# Patient Record
Sex: Female | Born: 1965 | Race: White | Hispanic: No | Marital: Married | Smoking: Former smoker
Health system: Southern US, Community
[De-identification: ages and names within clinical notes are randomized; demographics above are authoritative.]

## PROBLEM LIST (undated history)

## (undated) ENCOUNTER — Ambulatory Visit: Discharge status: Absent without leave | Payer: 59

## (undated) DIAGNOSIS — M797 Fibromyalgia: Secondary | ICD-10-CM

## (undated) DIAGNOSIS — F329 Major depressive disorder, single episode, unspecified: Secondary | ICD-10-CM

## (undated) DIAGNOSIS — G43909 Migraine, unspecified, not intractable, without status migrainosus: Secondary | ICD-10-CM

## (undated) DIAGNOSIS — J45909 Unspecified asthma, uncomplicated: Secondary | ICD-10-CM

## (undated) DIAGNOSIS — F32A Depression, unspecified: Secondary | ICD-10-CM

## (undated) DIAGNOSIS — F419 Anxiety disorder, unspecified: Secondary | ICD-10-CM

## (undated) HISTORY — DX: Depression, unspecified: F32.A

## (undated) HISTORY — PX: ABDOMINAL HYSTERECTOMY: SHX81

## (undated) HISTORY — DX: Migraine, unspecified, not intractable, without status migrainosus: G43.909

## (undated) HISTORY — DX: Unspecified asthma, uncomplicated: J45.909

## (undated) HISTORY — PX: COLONOSCOPY: SHX174

## (undated) HISTORY — DX: Fibromyalgia: M79.7

## (undated) HISTORY — PX: BILATERAL OOPHORECTOMY: SHX1221

## (undated) HISTORY — DX: Major depressive disorder, single episode, unspecified: F32.9

---

## 1999-07-01 DIAGNOSIS — G43E09 Chronic migraine with aura, not intractable, without status migrainosus: Secondary | ICD-10-CM | POA: Insufficient documentation

## 2007-10-21 DIAGNOSIS — M797 Fibromyalgia: Secondary | ICD-10-CM | POA: Insufficient documentation

## 2007-12-28 DIAGNOSIS — F419 Anxiety disorder, unspecified: Secondary | ICD-10-CM | POA: Insufficient documentation

## 2012-07-14 DIAGNOSIS — E7800 Pure hypercholesterolemia, unspecified: Secondary | ICD-10-CM | POA: Insufficient documentation

## 2015-07-19 DIAGNOSIS — J4520 Mild intermittent asthma, uncomplicated: Secondary | ICD-10-CM | POA: Insufficient documentation

## 2015-07-19 DIAGNOSIS — J4540 Moderate persistent asthma, uncomplicated: Secondary | ICD-10-CM | POA: Insufficient documentation

## 2015-11-12 ENCOUNTER — Encounter: Payer: Self-pay | Admitting: Internal Medicine

## 2015-11-27 ENCOUNTER — Ambulatory Visit: Payer: Self-pay | Admitting: Gastroenterology

## 2015-12-03 ENCOUNTER — Encounter: Payer: Self-pay | Admitting: Gastroenterology

## 2015-12-03 ENCOUNTER — Other Ambulatory Visit: Payer: Self-pay

## 2015-12-03 ENCOUNTER — Ambulatory Visit (INDEPENDENT_AMBULATORY_CARE_PROVIDER_SITE_OTHER): Payer: Managed Care, Other (non HMO) | Admitting: Gastroenterology

## 2015-12-03 VITALS — BP 149/77 | HR 110 | Temp 99.2°F | Ht 67.0 in | Wt 163.0 lb

## 2015-12-03 DIAGNOSIS — K625 Hemorrhage of anus and rectum: Secondary | ICD-10-CM | POA: Insufficient documentation

## 2015-12-03 DIAGNOSIS — K5900 Constipation, unspecified: Secondary | ICD-10-CM | POA: Insufficient documentation

## 2015-12-03 MED ORDER — PEG-KCL-NACL-NASULF-NA ASC-C 100 G PO SOLR: 1.0000 | ORAL | Status: DC

## 2015-12-03 NOTE — Progress Notes (Signed)
Primary Care Physician:  Kathlee Nations, MD  Primary Gastroenterologist:  Jonette Eva, MD   Chief Complaint  Patient presents with  . Abdominal Pain  . Nausea  . Diarrhea    HPI:  Pamela Scott is a 50 y.o. female here at the request of Dr. Kathlee Nations for further evaluation of diarrhea, abdominal pain, rectal bleeding. Patient states she had acute onset illness approximately one month ago. Symptoms began with diarrhea, developed blood per rectum after couple of days. Associated left lower quadrant abdominal pain. Symptoms resolved on her own. At baseline she generally has chronic constipation. If she does not have a bowel movement after 4 days, then she utilizes Correctol. Usually takes this once or twice per month. Rarely has heartburn. She takes Tagamet for hives. Digital rectal exam by her PCP showed no evidence of hemorrhoids, fissures. She was heme positive.. Patient was treated with Cipro 500 mg twice daily for 10 days.   No prior colonoscopy.   Current Outpatient Prescriptions  Medication Sig Dispense Refill  . albuterol (PROAIR HFA) 108 (90 Base) MCG/ACT inhaler Inhale into the lungs.    . busPIRone (BUSPAR) 10 MG tablet Take by mouth.    . cimetidine (TAGAMET) 400 MG tablet     . escitalopram (LEXAPRO) 20 MG tablet Take by mouth.    Marland Kitchen HYDROcodone-ibuprofen (VICOPROFEN) 7.5-200 MG tablet Take by mouth.    . hydrOXYzine (ATARAX/VISTARIL) 25 MG tablet Take by mouth.    Marland Kitchen tiZANidine (ZANAFLEX) 4 MG tablet Take by mouth.    . topiramate (TOPAMAX) 100 MG tablet Take by mouth.     No current facility-administered medications for this visit.    Allergies as of 12/03/2015 - Review Complete 12/03/2015  Allergen Reaction Noted  . Amoxicillin  12/03/2015  . Tape Rash 12/03/2015    Past Medical History  Diagnosis Date  . Asthma   . Fibromyalgia   . Depression   . Migraines     Past Surgical History  Procedure Laterality Date  . Abdominal hysterectomy  1990s   endometriosis  . Bilateral oophorectomy      Family History  Problem Relation Age of Onset  . Colon cancer Neg Hx   . Inflammatory bowel disease Neg Hx   . Rheum arthritis Mother   . Heart disease Paternal Grandfather   . Heart disease Paternal Grandmother     Social History   Social History  . Marital Status: Unknown    Spouse Name: N/A  . Number of Children: 0  . 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 Smoking status: Former Smoker    Start date: 12/02/1997  . Smokeless tobacco: Not on file  . Alcohol Use: Not on file  . Drug Use: Not on file  . Sexual Activity: Not on file   Other Topics Concern  . Not on file   Social History Narrative  . No narrative on file      ROS:  General: Negative for anorexia, weight loss, fever, chills, fatigue, weakness. Eyes: Negative for vision changes.  ENT: Negative for hoarseness, difficulty swallowing , nasal congestion. CV: Negative for chest pain, angina, palpitations, dyspnea on exertion, peripheral edema.  Respiratory: Negative for dyspnea at rest, dyspnea on exertion, cough, sputum, wheezing.  GI: See history of present illness. GU:  Negative for dysuria, hematuria, urinary incontinence, urinary frequency, nocturnal urination.  MS: Negative for joint pain, low back pain.  Derm: Negative for rash or itching.  Neuro: Negative for weakness, abnormal sensation, seizure, frequent headaches, memory loss, confusion.  Psych: Negative for anxiety, depression, suicidal ideation, hallucinations.  Endo: Negative for unusual weight change.  Heme: Negative for bruising or bleeding. Allergy: Negative for rash or hives.    Physical Examination:  BP 149/77 mmHg  Pulse 110  Temp(Src) 99.2 F (37.3 C)  Ht  (1.702 m)  Wt 163 lb (73.936 kg)  BMI 25.52 kg/m2   General: Well-nourished, well-developed in no acute distress.  Head: Normocephalic, atraumatic.    Eyes: Conjunctiva pink, no icterus. Mouth: Oropharyngeal mucosa moist and pink , no lesions erythema or exudate. Neck: Supple without thyromegaly, masses, or lymphadenopathy.  Lungs: Clear to auscultation bilaterally.  Heart: Regular rate and rhythm, no murmurs rubs or gallops.  Abdomen: Bowel sounds are normal, nontender, nondistended, no hepatosplenomegaly or masses, no abdominal bruits or    hernia , no rebound or guarding.   Rectal: Not performed Extremities: No lower extremity edema. No clubbing or deformities.  Neuro: Alert and oriented x 4 , grossly normal neurologically.  Skin: Warm and dry, no rash or jaundice.   Psych: Alert and cooperative, normal mood and affect.  Labs: Labs from 11/07/2015 White blood cell count 8100, hemoglobin 12.5, platelets 296,000, sodium 139, potassium 3.3, albumin 4.3, BUN 17, creatinine 0.94, total bilirubin 0.6, alkaline phosphatase 69, AST 22, ALT 23, albumin 4.3, amylase 33.  Imaging Studies: No results found.

## 2015-12-03 NOTE — Patient Instructions (Signed)
Colonoscopy as scheduled. See separate instructions.  For chronic constipation, you can take miralax one capful daily as needed.   Constipation, Adult Constipation is when a person has fewer than three bowel movements a week, has difficulty having a bowel movement, or has stools that are dry, hard, or larger than normal. As people grow older, constipation is more common. A low-fiber diet, not taking in enough fluids, and taking certain medicines may make constipation worse.  CAUSES   Certain medicines, such as antidepressants, pain medicine, iron supplements, antacids, and water pills.   Certain diseases, such as diabetes, irritable bowel syndrome (IBS), thyroid disease, or depression.   Not drinking enough water.   Not eating enough fiber-rich foods.   Stress or travel.   Lack of physical activity or exercise.   Ignoring the urge to have a bowel movement.   Using laxatives too much.  SIGNS AND SYMPTOMS   Having fewer than three bowel movements a week.   Straining to have a bowel movement.   Having stools that are hard, dry, or larger than normal.   Feeling full or bloated.   Pain in the lower abdomen.   Not feeling relief after having a bowel movement.  DIAGNOSIS  Your health care provider will take a medical history and perform a physical exam. Further testing may be done for severe constipation. Some tests may include:  A barium enema X-ray to examine your rectum, colon, and, sometimes, your small intestine.   A sigmoidoscopy to examine your lower colon.   A colonoscopy to examine your entire colon. TREATMENT  Treatment will depend on the severity of your constipation and what is causing it. Some dietary treatments include drinking more fluids and eating more fiber-rich foods. Lifestyle treatments may include regular exercise. If these diet and lifestyle recommendations do not help, your health care provider may recommend taking over-the-counter laxative  medicines to help you have bowel movements. Prescription medicines may be prescribed if over-the-counter medicines do not work.  HOME CARE INSTRUCTIONS   Eat foods that have a lot of fiber, such as fruits, vegetables, whole grains, and beans.  Limit foods high in fat and processed sugars, such as french fries, hamburgers, cookies, candies, and soda.   A fiber supplement may be added to your diet if you cannot get enough fiber from foods.   Drink enough fluids to keep your urine clear or pale yellow.   Exercise regularly or as directed by your health care provider.   Go to the restroom when you have the urge to go. Do not hold it.   Only take over-the-counter or prescription medicines as directed by your health care provider. Do not take other medicines for constipation without talking to your health care provider first.  SEEK IMMEDIATE MEDICAL CARE IF:   You have bright red blood in your stool.   Your constipation lasts for more than 4 days or gets worse.   You have abdominal or rectal pain.   You have thin, pencil-like stools.   You have unexplained weight loss. MAKE SURE YOU:   Understand these instructions.  Will watch your condition.  Will get help right away if you are not doing well or get worse.   This information is not intended to replace advice given to you by your health care provider. Make sure you discuss any questions you have with your health care provider.   Document Released: 06/20/2004 Document Revised: 10/13/2014 Document Reviewed: 07/04/2013 Elsevier Interactive Patient Education [employer]

## 2015-12-05 ENCOUNTER — Other Ambulatory Visit: Payer: Self-pay

## 2015-12-05 DIAGNOSIS — Z79899 Other long term (current) drug therapy: Secondary | ICD-10-CM

## 2015-12-05 NOTE — Assessment & Plan Note (Signed)
50 year old female with recent diarrheal illness, likely viral gastroenteritis, who presents for further evaluation of rectal bleeding. She has never had a colonoscopy. At baseline she has constipation and takes occasional laxative. She can utilize MiraLAX 1 capful daily as needed for constipation. We discussed prescription options but she would like to continue over-the-counter options for now. Recommend colonoscopy in the near future. Augment conscious sedation with Phenergan 25 mg IV 30 minutes before the procedure given polypharmacy.  I have discussed the risks, alternatives, benefits with regards to but not limited to the risk of reaction to medication, bleeding, infection, perforation and the patient is agreeable to proceed. Written consent to be obtained.  Encouraged her to make sure she is having regular BMs leading up into the bowel prep in order to facilitate adequate bowel preparation.

## 2015-12-05 NOTE — Progress Notes (Signed)
CC'ED TO PCP 

## 2015-12-05 NOTE — Progress Notes (Signed)
Can we please add Phenergan 25 mg IV 30 minutes before her colonoscopy due to polypharmacy.

## 2015-12-05 NOTE — Progress Notes (Signed)
Done

## 2015-12-06 ENCOUNTER — Other Ambulatory Visit: Payer: Self-pay

## 2015-12-24 ENCOUNTER — Encounter (HOSPITAL_COMMUNITY)
Admission: RE | Disposition: A | Discharge status: Home / Self Care | Payer: Self-pay | Source: Ambulatory Visit | Attending: Gastroenterology

## 2015-12-24 ENCOUNTER — Ambulatory Visit (HOSPITAL_COMMUNITY)
Admission: RE | Admit: 2015-12-24 | Discharge: 2015-12-24 | Disposition: A | Discharge status: Home / Self Care | Payer: Managed Care, Other (non HMO) | Source: Ambulatory Visit | Attending: Gastroenterology | Admitting: Gastroenterology

## 2015-12-24 ENCOUNTER — Encounter (HOSPITAL_COMMUNITY): Payer: Self-pay | Admitting: *Deleted

## 2015-12-24 DIAGNOSIS — K5730 Diverticulosis of large intestine without perforation or abscess without bleeding: Secondary | ICD-10-CM | POA: Diagnosis not present

## 2015-12-24 DIAGNOSIS — Z87891 Personal history of nicotine dependence: Secondary | ICD-10-CM | POA: Diagnosis not present

## 2015-12-24 DIAGNOSIS — R197 Diarrhea, unspecified: Secondary | ICD-10-CM | POA: Insufficient documentation

## 2015-12-24 DIAGNOSIS — R1084 Generalized abdominal pain: Secondary | ICD-10-CM | POA: Insufficient documentation

## 2015-12-24 DIAGNOSIS — J45909 Unspecified asthma, uncomplicated: Secondary | ICD-10-CM | POA: Diagnosis not present

## 2015-12-24 DIAGNOSIS — F329 Major depressive disorder, single episode, unspecified: Secondary | ICD-10-CM | POA: Insufficient documentation

## 2015-12-24 DIAGNOSIS — F419 Anxiety disorder, unspecified: Secondary | ICD-10-CM | POA: Insufficient documentation

## 2015-12-24 DIAGNOSIS — K921 Melena: Secondary | ICD-10-CM | POA: Diagnosis not present

## 2015-12-24 DIAGNOSIS — M797 Fibromyalgia: Secondary | ICD-10-CM | POA: Insufficient documentation

## 2015-12-24 DIAGNOSIS — Z79899 Other long term (current) drug therapy: Secondary | ICD-10-CM | POA: Insufficient documentation

## 2015-12-24 DIAGNOSIS — K625 Hemorrhage of anus and rectum: Secondary | ICD-10-CM

## 2015-12-24 HISTORY — DX: Anxiety disorder, unspecified: F41.9

## 2015-12-24 HISTORY — PX: COLONOSCOPY: SHX5424

## 2015-12-24 SURGERY — COLONOSCOPY
Anesthesia: Moderate Sedation

## 2015-12-24 MED ORDER — MEPERIDINE HCL 100 MG/ML IJ SOLN
INTRAMUSCULAR | Status: AC
  Filled 2015-12-24: qty 2

## 2015-12-24 MED ORDER — MIDAZOLAM HCL 5 MG/5ML IJ SOLN
INTRAMUSCULAR | Status: DC | PRN
  Administered 2015-12-24: 1 mg via INTRAVENOUS
  Administered 2015-12-24: 2 mg via INTRAVENOUS
  Administered 2015-12-24: 1 mg via INTRAVENOUS

## 2015-12-24 MED ORDER — MIDAZOLAM HCL 5 MG/5ML IJ SOLN
INTRAMUSCULAR | Status: AC
  Filled 2015-12-24: qty 10

## 2015-12-24 MED ORDER — PROMETHAZINE HCL 25 MG/ML IJ SOLN
25.0000 mg | Freq: Once | INTRAMUSCULAR | Status: AC
  Administered 2015-12-24: 25 mg via INTRAVENOUS

## 2015-12-24 MED ORDER — STERILE WATER FOR IRRIGATION IR SOLN
Status: DC | PRN
  Administered 2015-12-24: 14:00:00

## 2015-12-24 MED ORDER — MEPERIDINE HCL 100 MG/ML IJ SOLN
INTRAMUSCULAR | Status: DC | PRN
  Administered 2015-12-24: 50 mg via INTRAVENOUS
  Administered 2015-12-24 (×2): 25 mg via INTRAVENOUS

## 2015-12-24 MED ORDER — PROMETHAZINE HCL 25 MG/ML IJ SOLN
INTRAMUSCULAR | Status: AC
  Filled 2015-12-24: qty 1

## 2015-12-24 MED ORDER — SODIUM CHLORIDE 0.9% FLUSH
INTRAVENOUS | Status: AC
  Filled 2015-12-24: qty 10

## 2015-12-24 MED ORDER — SODIUM CHLORIDE 0.9 % IV SOLN
INTRAVENOUS | Status: DC
Start: 2015-12-24 — End: 2015-12-24
  Administered 2015-12-24: 12:00:00 via INTRAVENOUS

## 2015-12-24 NOTE — Discharge Instructions (Signed)
YOU MOST LIKELY HAD AN ACUTE COLITIS, WHICH HAS RESOLVED. I FOUND ONE DIVERTICULUM IN YOUR LEFT COLON. You have SMALL internal hemorrhoids. YOU DID NOT HAVE ANY POLYPS. YOU HAVE A FLOPPY SIGMOID AND TRANSVERSE COLON.   DRINK WATER TO KEEP YOUR URINE LIGHT YELLOW.  FOLLOW A HIGH FIBER DIET. AVOID ITEMS THAT CAUSE BLOATING. SEE INFO BELOW.  USE MILK OF MAGNESIA PILLS(2) AT LEAST ONCE DAILY AND UP TO THREE TIMES A DAY TO REDUCE CONSTIPATION.  PLEASE CALL IN ONE MONTH IF YOUR CONSTIPATION IS NOT BETTER THEN WE CAN PRESCRIBE LINZESS.  FOLLOW UP IN 4 MOS.   Next colonoscopy in 10 years.   Colonoscopy Care After Read the instructions outlined below and refer to this sheet in the next week. These discharge instructions provide you with general information on caring for yourself after you leave the hospital. While your treatment has been planned according to the most current medical practices available, unavoidable complications occasionally occur. If you have any problems or questions after discharge, call DR. Gennaro Lizotte, [phone].  ACTIVITY  You may resume your regular activity, but move at a slower pace for the next 24 hours.   Take frequent rest periods for the next 24 hours.   Walking will help get rid of the air and reduce the bloated feeling in your belly (abdomen).   No driving for 24 hours (because of the medicine (anesthesia) used during the test).   You may shower.   Do not sign any important legal documents or operate any machinery for 24 hours (because of the anesthesia used during the test).    NUTRITION  Drink plenty of fluids.   You may resume your normal diet as instructed by your doctor.   Begin with a light meal and progress to your normal diet. Heavy or fried foods are harder to digest and may make you feel sick to your stomach (nauseated).   Avoid alcoholic beverages for 24 hours or as instructed.    MEDICATIONS  You may resume your normal  medications.   WHAT YOU CAN EXPECT TODAY  Some feelings of bloating in the abdomen.   Passage of more gas than usual.   Spotting of blood in your stool or on the toilet paper  .  IF YOU HAD POLYPS REMOVED DURING THE COLONOSCOPY:  Eat a soft diet IF YOU HAVE NAUSEA, BLOATING, ABDOMINAL PAIN, OR VOMITING.    FINDING OUT THE RESULTS OF YOUR TEST Not all test results are available during your visit. DR. Darrick PennaFIELDS WILL CALL YOU WITHIN 7 DAYS OF YOUR PROCEDUE WITH YOUR RESULTS. Do not assume everything is normal if you have not heard from DR. Doryce Mcgregory IN ONE WEEK, CALL HER OFFICE AT [phone].  SEEK IMMEDIATE MEDICAL ATTENTION AND CALL THE OFFICE: [phone] IF:  You have more than a spotting of blood in your stool.   Your belly is swollen (abdominal distention).   You are nauseated or vomiting.   You have a temperature over 101F.   You have abdominal pain or discomfort that is severe or gets worse throughout the day.  High-Fiber Diet A high-fiber diet changes your normal diet to include more whole grains, legumes, fruits, and vegetables. Changes in the diet involve replacing refined carbohydrates with unrefined foods. The calorie level of the diet is essentially unchanged. The Dietary Reference Intake (recommended amount) for adult males is 38 grams per day. For adult females, it is 25 grams per day. Pregnant and lactating women should consume 28 grams of fiber  per day. Fiber is the intact part of a plant that is not broken down during digestion. Functional fiber is fiber that has been isolated from the plant to provide a beneficial effect in the body. PURPOSE  Increase stool bulk.   Ease and regulate bowel movements.   Lower cholesterol.  REDUCE RISK OF COLON CANCER  INDICATIONS THAT YOU NEED MORE FIBER  Constipation and hemorrhoids.   Uncomplicated diverticulosis (intestine condition) and irritable bowel syndrome.   Weight management.   As a protective measure against  hardening of the arteries (atherosclerosis), diabetes, and cancer.   GUIDELINES FOR INCREASING FIBER IN THE DIET  Start adding fiber to the diet slowly. A gradual increase of about 5 more grams (2 slices of whole-wheat bread, 2 servings of most fruits or vegetables, or 1 bowl of high-fiber cereal) per day is best. Too rapid an increase in fiber may result in constipation, flatulence, and bloating.   Drink enough water and fluids to keep your urine clear or pale yellow. Water, juice, or caffeine-free drinks are recommended. Not drinking enough fluid may cause constipation.   Eat a variety of high-fiber foods rather than one type of fiber.   Try to increase your intake of fiber through using high-fiber foods rather than fiber pills or supplements that contain small amounts of fiber.   The goal is to change the types of food eaten. Do not supplement your present diet with high-fiber foods, but replace foods in your present diet.   INCLUDE A VARIETY OF FIBER SOURCES  Replace refined and processed grains with whole grains, canned fruits with fresh fruits, and incorporate other fiber sources. White rice, white breads, and most bakery goods contain little or no fiber.   Brown whole-grain rice, buckwheat oats, and many fruits and vegetables are all good sources of fiber. These include: broccoli, Brussels sprouts, cabbage, cauliflower, beets, sweet potatoes, white potatoes (skin on), carrots, tomatoes, eggplant, squash, berries, fresh fruits, and dried fruits.   Cereals appear to be the richest source of fiber. Cereal fiber is found in whole grains and bran. Bran is the fiber-rich outer coat of cereal grain, which is largely removed in refining. In whole-grain cereals, the bran remains. In breakfast cereals, the largest amount of fiber is found in those with "bran" in their names. The fiber content is sometimes indicated on the label.   You may need to include additional fruits and vegetables each day.    In baking, for 1 cup white flour, you may use the following substitutions:   1 cup whole-wheat flour minus 2 tablespoons.   1/2 cup white flour plus 1/2 cup whole-wheat flour.   Hemorrhoids Hemorrhoids are dilated (enlarged) veins around the rectum. Sometimes clots will form in the veins. This makes them swollen and painful. These are called thrombosed hemorrhoids. Causes of hemorrhoids include:  Constipation.   Straining to have a bowel movement.   HEAVY LIFTING  HOME CARE INSTRUCTIONS  Eat a well balanced diet and drink 6 to 8 glasses of water every day to avoid constipation. You may also use a bulk laxative.   Avoid straining to have bowel movements.   Keep anal area dry and clean.   Do not use a donut shaped pillow or sit on the toilet for long periods. This increases blood pooling and pain.   Move your bowels when your body has the urge; this will require less straining and will decrease pain and pressure.

## 2015-12-24 NOTE — Progress Notes (Signed)
REVIEWED-NO ADDITIONAL RECOMMENDATIONS. 

## 2015-12-24 NOTE — Op Note (Signed)
[facility] Patient Name: Pamela Scott Procedure Date: 12/24/2015 2:00 PM MRN: [id number] Date of Birth: 02-Sep-1966 Attending MD: Jonette Eva , MD CSN: [id number] Age: 50 Admit Type: Outpatient Procedure:                Colonoscopy Indications:              Hematochezia, Generalized abdominal pain, Diarrhea                            of presumed infectious origin Providers:                Jonette Eva, MD, Edrick Kins, RN, Burke Keels,                            Technician Referring MD:             Kathlee Nations (Referring MD) Medicines:                Promethazine 25 mg IV, Meperidine 100 mg IV,                            Midazolam 4 mg IV Complications:            No immediate complications. Estimated Blood Loss:     Estimated blood loss: none. Procedure:                Pre-Anesthesia Assessment:                           - Prior to the procedure, a History and Physical                            was performed, and patient medications and                            allergies were reviewed. The patient's tolerance of                            previous anesthesia was also reviewed. The risks                            and benefits of the procedure and the sedation                            options and risks were discussed with the patient.                            All questions were answered, and informed consent                            was obtained. Prior Anticoagulants: The patient has                            taken no previous anticoagulant or antiplatelet  agents. ASA Grade Assessment: II - A patient with                            mild systemic disease. After reviewing the risks                            and benefits, the patient was deemed in                            satisfactory condition to undergo the procedure.                            After obtaining informed consent, the colonoscope                            was passed  under direct vision. Throughout the                            procedure, the patient's blood pressure, pulse, and                            oxygen saturations were monitored continuously. The                            EC-[id number] ([id number]) scope was introduced through                            the anus and advanced to the the terminal ileum.                            The terminal ileum, ileocecal valve, appendiceal                            orifice, and rectum were photographed. The                            colonoscopy was performed with moderate difficulty                            due to significant looping. The quality of the                            bowel preparation was excellent. Scope In: 2:18:38 PM Scope Out: 2:50:50 PM Total Procedure Duration: 0 hours 32 minutes 12 seconds  Findings:      The digital rectal exam was normal.      The terminal ileum appeared normal.      The recto-sigmoid colon and transverse colon revealed significantly       excessive looping. Advancing the scope required changing the patient to       a supine position and using manual pressure.      A few small-mouthed diverticula were found in the sigmoid colon. Impression:               - The examined portion of the ileum was normal.                           -  There was significant looping of the colon.                           - VERY MILD Diverticulosis in the sigmoid colon.                           - No specimens collected.                           ABDOMINAL PAIN/DIARRHEA/RECTAL BLEEDING DUE TO                            RESOLVED INFECTIOUS COLITIS Moderate Sedation:      Moderate (conscious) sedation was administered by the endoscopy nurse       and supervised by the endoscopist. The following parameters were       monitored: oxygen saturation, heart rate, blood pressure, respiratory       rate, EKG, adequacy of pulmonary ventilation, and response to care.       Total physician intraservice  time was 47 minutes. Recommendation:           - Patient has a contact number available for                            emergencies. The signs and symptoms of potential                            delayed complications were discussed with the                            patient. Return to normal activities tomorrow.                            Written discharge instructions were provided to the                            patient.                           - High fiber diet.                           - Repeat colonoscopy in 10 years for surveillance.                           - Continue present medications. Procedure Code(s):        --- Professional ---                           [phone], Colonoscopy, flexible; diagnostic, including                            collection of specimen(s) by brushing or washing,                            when performed (separate procedure)  54098, Moderate sedation services; each additional                            15 minutes intraservice time                           99153, Moderate sedation services; each additional                            15 minutes intraservice time                           G0500, Moderate sedation services provided by the                            same physician or other qualified health care                            professional performing a gastrointestinal                            endoscopic service that sedation supports,                            requiring the presence of an independent trained                            observer to assist in the monitoring of the                            patient's level of consciousness and physiological                            status; initial 15 minutes of intra-service time;                            patient age 79 years or older (additional time may                            be reported with 11914, as appropriate) Diagnosis Code(s):        --- Professional  ---                           K92.1, Melena (includes Hematochezia)                           R10.84, Generalized abdominal pain                           R19.7, Diarrhea, unspecified                           K57.30, Diverticulosis of large intestine without                            perforation or abscess without  bleeding CPT copyright 2016 [employer]. All rights reserved. The codes documented in this report are preliminary and upon coder review may  be revised to meet current compliance requirements. Jonette EvaSandi Rafaella Kole, MD Jonette EvaSandi Ashvin Adelson, MD 12/24/2015 7:31:14 PM This report has been signed electronically. Number of Addenda: 0

## 2015-12-24 NOTE — H&P (Signed)
  Primary Care Physician:  Eber Hong, MD Primary Gastroenterologist:  Dr. Oneida Alar  Pre-Procedure History & Physical: HPI:  Pamela Scott is a 50 y.o. female here for Diarrhea/BRBPR.  Past Medical History  Diagnosis Date  . Asthma   . Fibromyalgia   . Depression   . Migraines   . Anxiety     Past Surgical History  Procedure Laterality Date  . Abdominal hysterectomy  1990s    endometriosis  . Bilateral oophorectomy    . Colonoscopy      Prior to Admission medications   Medication Sig Start Date End Date Taking? Authorizing Provider  busPIRone (BUSPAR) 10 MG tablet Take by mouth. 08/30/13  Yes Historical Provider, MD  escitalopram (LEXAPRO) 20 MG tablet Take by mouth.   Yes Historical Provider, MD  gabapentin (NEURONTIN) 300 MG capsule Take 300 mg by mouth 3 (three) times daily.   Yes Historical Provider, MD  HYDROcodone-ibuprofen (VICOPROFEN) 7.5-200 MG tablet Take by mouth.   Yes Historical Provider, MD  peg 3350 powder (MOVIPREP) 100 g SOLR Take 1 kit (200 g total) by mouth as directed. 12/03/15  Yes Danie Binder, MD  tiZANidine (ZANAFLEX) 4 MG tablet Take by mouth.   Yes Historical Provider, MD  topiramate (TOPAMAX) 100 MG tablet Take by mouth.   Yes Historical Provider, MD  albuterol (PROAIR HFA) 108 (90 Base) MCG/ACT inhaler Inhale into the lungs. 07/19/15   Historical Provider, MD  cimetidine (TAGAMET) 400 MG tablet     Historical Provider, MD  hydrOXYzine (ATARAX/VISTARIL) 25 MG tablet Take by mouth. 04/21/14   Historical Provider, MD    Allergies as of 12/03/2015 - Review Complete 12/03/2015  Allergen Reaction Noted  . Amoxicillin  12/03/2015  . Tape Rash 12/03/2015    Family History  Problem Relation Age of Onset  . Colon cancer Neg Hx   . Inflammatory bowel disease Neg Hx   . Rheum arthritis Mother   . Heart disease Paternal Grandfather   . Heart disease Paternal Grandmother     Social History   Social History  . Marital Status: Married    Spouse  Name: N/A  . Number of Children: 0  . 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 Smoking status: Former Smoker -- 1.00 packs/day for 17 years    Types: Cigarettes    Start date: 12/02/1997  . Smokeless tobacco: Not on file  . Alcohol Use: No  . Drug Use: No  . Sexual Activity: Not on file   Other Topics Concern  . Not on file   Social History Narrative    Review of Systems: See HPI, otherwise negative ROS   Physical Exam: BP 120/75 mmHg  Pulse 84  Temp(Src) 98.2 F (36.8 C) (Oral)  Resp 12  Ht _0  (1.702 m)  Wt 163 lb (73.936 kg)  BMI 25.52 kg/m2  SpO2 98% General:   Alert,  pleasant and cooperative in NAD Head:  Normocephalic and atraumatic. Neck:  Supple; Lungs:  Clear throughout to auscultation.    Heart:  Regular rate and rhythm. Abdomen:  Soft, nontender and nondistended. Normal bowel sounds, without guarding, and without rebound.   Neurologic:  Alert and  oriented x4;  grossly normal neurologically.  Impression/Plan:     Diarrhea/BRBPR  PLAN: TCS TODAY WITH BIOPSY

## 2015-12-27 ENCOUNTER — Encounter (HOSPITAL_COMMUNITY): Payer: Self-pay | Admitting: Gastroenterology

## 2016-04-23 ENCOUNTER — Ambulatory Visit: Payer: Managed Care, Other (non HMO) | Admitting: Gastroenterology

## 2016-04-23 ENCOUNTER — Telehealth: Payer: Self-pay | Admitting: Gastroenterology

## 2016-04-23 ENCOUNTER — Encounter: Payer: Self-pay | Admitting: Gastroenterology

## 2016-04-23 NOTE — Telephone Encounter (Signed)
PATIENT WAS A NO SHOW AND LETTER SENT  °

## 2016-04-23 NOTE — Telephone Encounter (Signed)
[facility]

## 2018-01-27 ENCOUNTER — Ambulatory Visit: Payer: Managed Care, Other (non HMO) | Admitting: Urology

## 2018-09-01 ENCOUNTER — Ambulatory Visit (HOSPITAL_COMMUNITY)
Admission: RE | Admit: 2018-09-01 | Discharge: 2018-09-01 | Disposition: A | Discharge status: Home / Self Care | Payer: BLUE CROSS/BLUE SHIELD | Source: Ambulatory Visit | Attending: Family | Admitting: Family

## 2018-09-01 ENCOUNTER — Other Ambulatory Visit (HOSPITAL_COMMUNITY): Payer: Self-pay | Admitting: Family

## 2018-09-01 DIAGNOSIS — M545 Low back pain, unspecified: Secondary | ICD-10-CM

## 2019-04-21 ENCOUNTER — Other Ambulatory Visit: Payer: BLUE CROSS/BLUE SHIELD

## 2019-04-21 ENCOUNTER — Other Ambulatory Visit: Payer: Self-pay

## 2019-04-21 DIAGNOSIS — Z20822 Contact with and (suspected) exposure to covid-19: Secondary | ICD-10-CM

## 2019-04-25 LAB — NOVEL CORONAVIRUS, NAA: SARS-CoV-2, NAA: NOT DETECTED

## 2020-06-07 ENCOUNTER — Other Ambulatory Visit: Payer: Self-pay | Admitting: Internal Medicine

## 2020-06-07 DIAGNOSIS — E041 Nontoxic single thyroid nodule: Secondary | ICD-10-CM

## 2020-06-19 ENCOUNTER — Other Ambulatory Visit (HOSPITAL_COMMUNITY)
Admission: RE | Admit: 2020-06-19 | Discharge: 2020-06-19 | Disposition: A | Discharge status: Home / Self Care | Payer: 59 | Source: Ambulatory Visit | Attending: Radiology | Admitting: Radiology

## 2020-06-19 ENCOUNTER — Ambulatory Visit
Admission: RE | Admit: 2020-06-19 | Discharge: 2020-06-19 | Disposition: A | Discharge status: Home / Self Care | Payer: 59 | Source: Ambulatory Visit | Attending: Internal Medicine | Admitting: Internal Medicine

## 2020-06-19 DIAGNOSIS — E041 Nontoxic single thyroid nodule: Secondary | ICD-10-CM | POA: Insufficient documentation

## 2020-06-21 LAB — CYTOLOGY - NON PAP

## 2020-07-06 ENCOUNTER — Encounter (HOSPITAL_COMMUNITY): Payer: Self-pay

## 2020-07-17 ENCOUNTER — Encounter (HOSPITAL_COMMUNITY): Payer: Self-pay

## 2021-06-11 ENCOUNTER — Other Ambulatory Visit: Payer: Self-pay | Admitting: Internal Medicine

## 2021-06-11 DIAGNOSIS — E041 Nontoxic single thyroid nodule: Secondary | ICD-10-CM

## 2021-08-21 ENCOUNTER — Ambulatory Visit
Admission: RE | Admit: 2021-08-21 | Discharge: 2021-08-21 | Disposition: A | Discharge status: Home / Self Care | Payer: 59 | Source: Ambulatory Visit | Attending: Internal Medicine | Admitting: Internal Medicine

## 2021-08-21 DIAGNOSIS — E041 Nontoxic single thyroid nodule: Secondary | ICD-10-CM

## 2022-02-02 IMAGING — US US FNA BIOPSY THYROID 1ST LESION
1 series · 13 of 15 positions shown · non-contrast
Comparison: Thyroid ultrasound performed 04/27/2020.

MEDICATIONS:
None

COMPLICATIONS:
None immediate.

INDICATION: Indeterminate left superior thyroid nodule

EXAM:
ULTRASOUND GUIDED FINE NEEDLE ASPIRATION OF INDETERMINATE LEFT
SUPERIOR THYROID NODULE
TECHNIQUE: Informed written consent was obtained from the patient after a
discussion of the risks, benefits and alternatives to treatment.
Questions regarding the procedure were encouraged and answered. A
timeout was performed prior to the initiation of the procedure.

[Series 1: us fna biopsy thyroid 1st lesion · 0.04mm/px · 15 acquisitions, 13 frames shown]
[im 1/15]
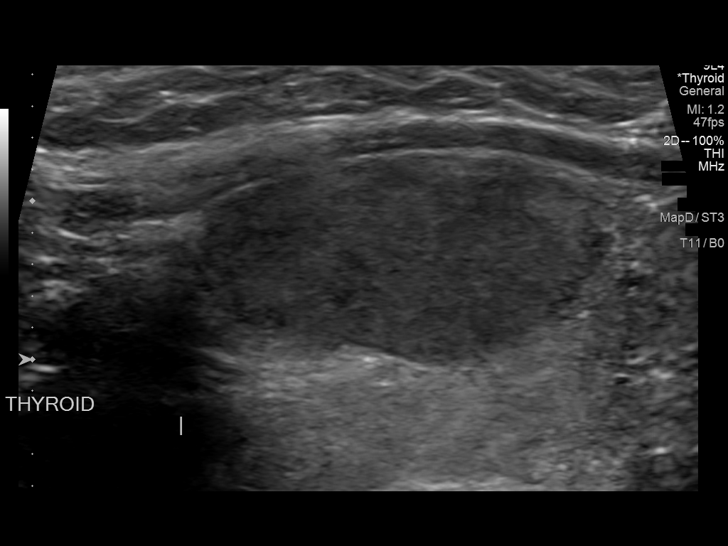
[im 2/15]
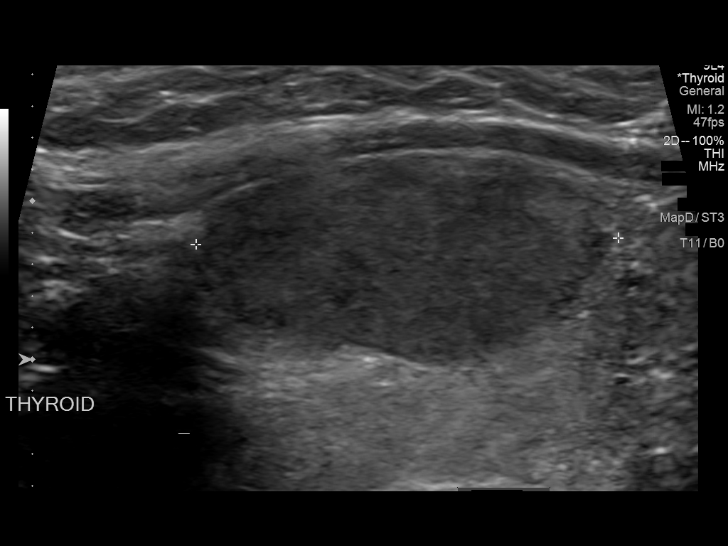
[im 3/15]
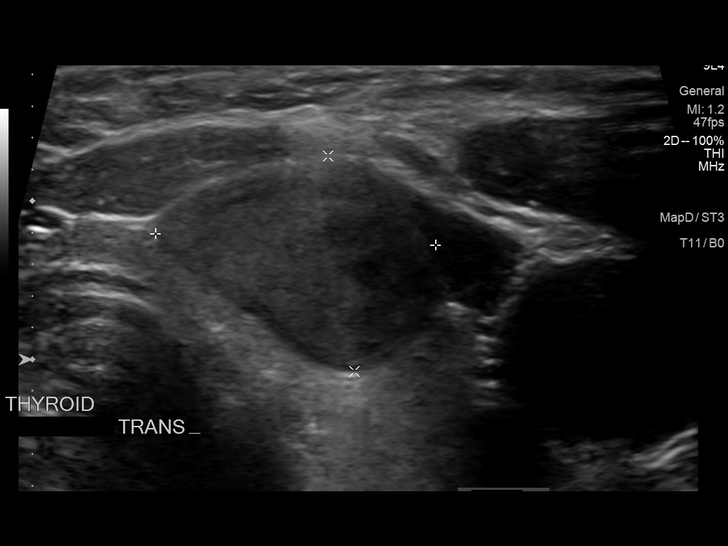
[im 5/15]
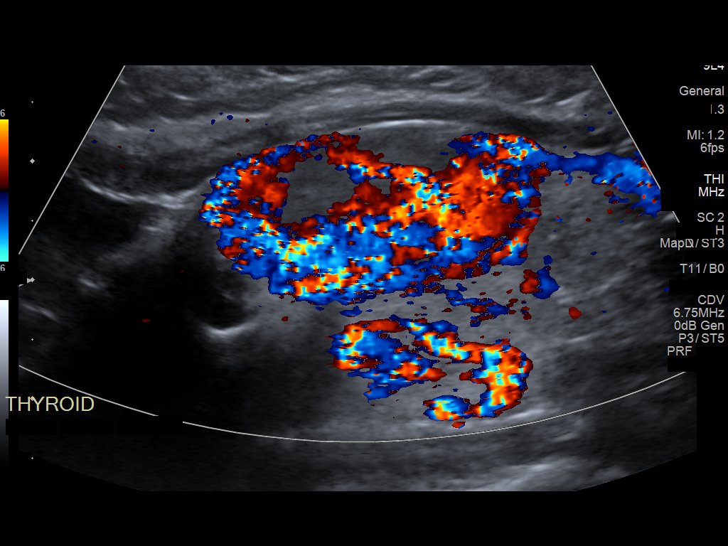
[im 6/15]
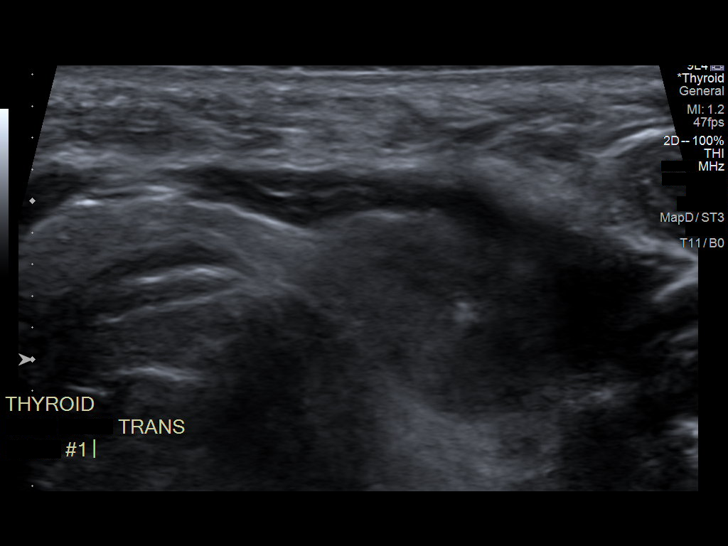
[im 7/15]
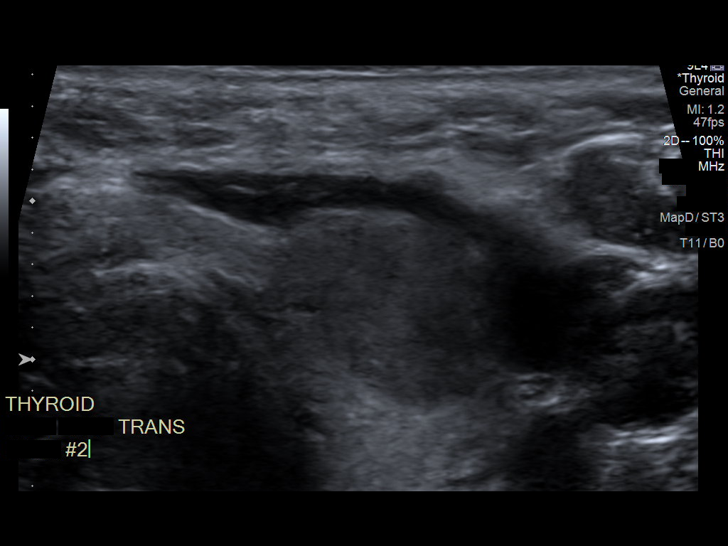
[im 8/15]
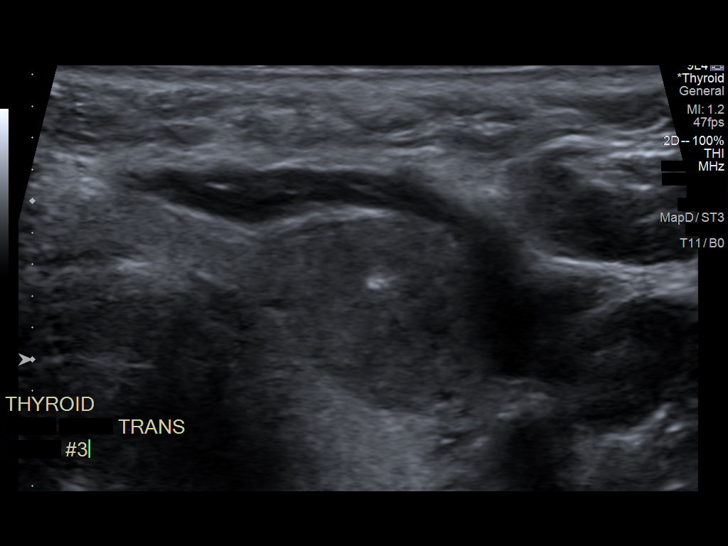
[im 9/15]
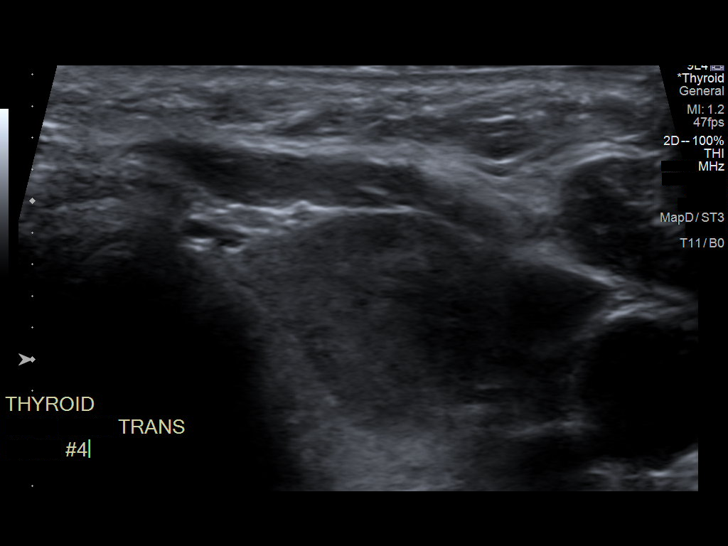
[im 10/15]
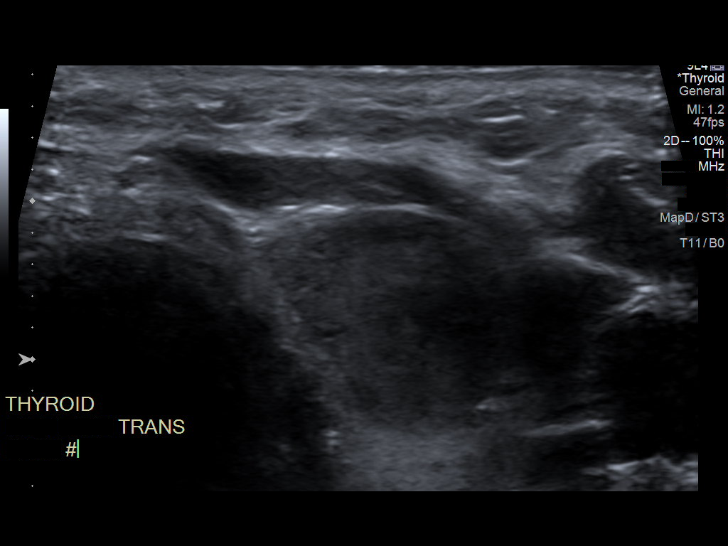
[im 11/15]
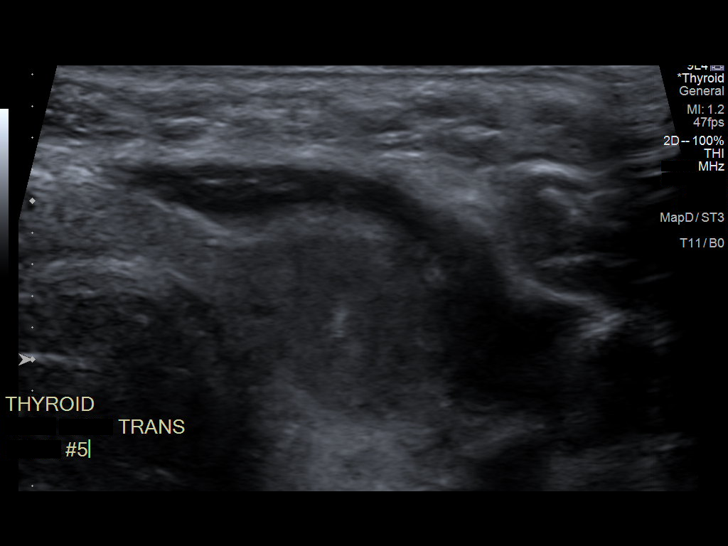
[im 13/15]
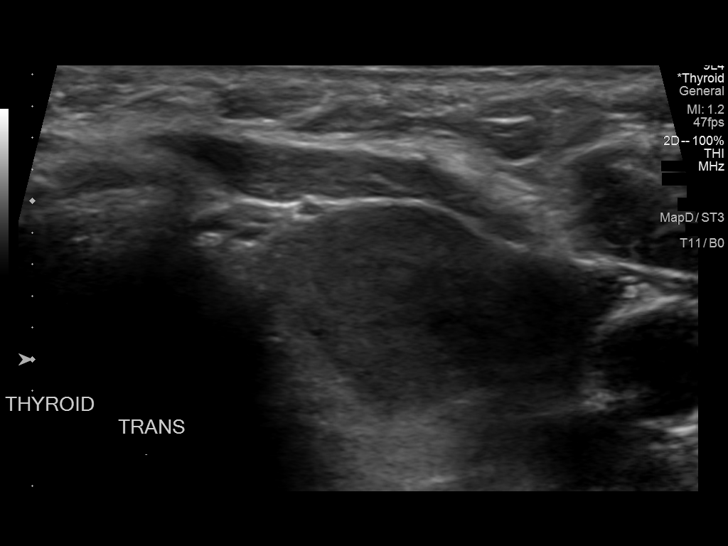
[im 14/15]
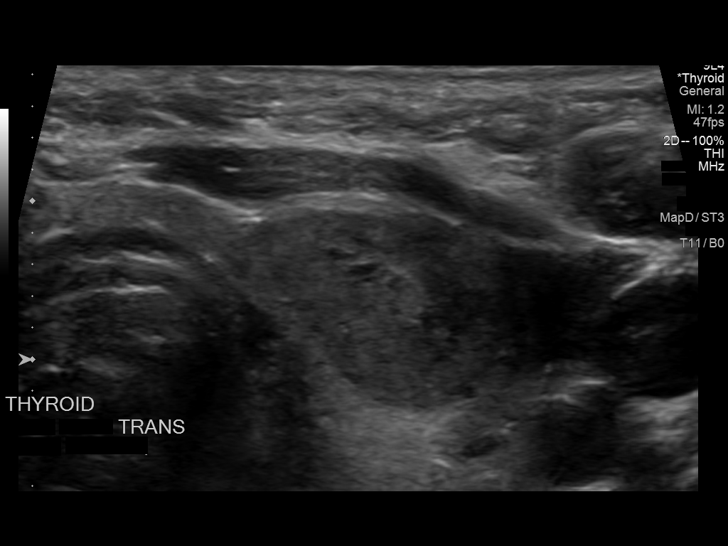
[im 15/15]
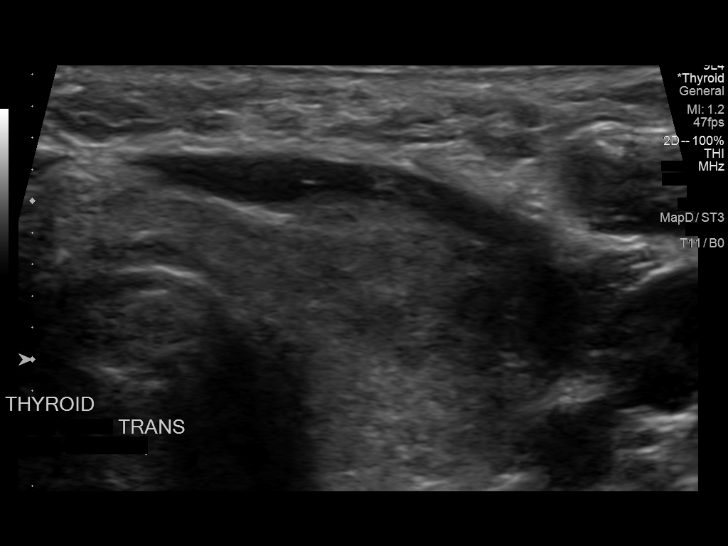

[13 of 15 positions shown; findings below may reference images not displayed]

Pre-procedural ultrasound scanning demonstrated unchanged size and
appearance of the indeterminate nodule within the left thyroid lobe

The procedure was planned. The neck was prepped in the usual sterile
fashion, and a sterile drape was applied covering the operative
field. A timeout was performed prior to the initiation of the
procedure. Local anesthesia was provided with 1% lidocaine.

Under direct ultrasound guidance, 5 FNA biopsies were performed of
the left superior thyroid nodule with a 25 gauge needle. Multiple
ultrasound images were saved for procedural documentation purposes.
The samples were prepared and submitted to pathology.

Limited post procedural scanning was negative for hematoma or
additional complication. Dressings were placed. The patient
tolerated the above procedures procedure well without immediate
postprocedural complication.
FINDINGS: FINDINGS
Nodule reference number based on prior diagnostic ultrasound: 1

Maximum size: 2.6 cm

Location: Left  ;  Superior

ACR TI-RADS total points: 4

ACR TI-RADS risk category:  TR4 (4-6 points)

Prior biopsy:  No

Reason for biopsy: meets ACR TI-RADS criteria

Ultrasound imaging confirms appropriate placement of the needles
within the thyroid nodule.
IMPRESSION: Technically successful ultrasound guided fine needle aspiration of
left superior thyroid nodule as described above.

## 2022-02-13 ENCOUNTER — Ambulatory Visit
Admission: RE | Admit: 2022-02-13 | Discharge: 2022-02-13 | Disposition: A | Discharge status: Home / Self Care | Payer: 59 | Source: Ambulatory Visit

## 2022-02-13 VITALS — BP 152/81 | HR 88 | Temp 97.6°F | Resp 18

## 2022-02-13 DIAGNOSIS — R42 Dizziness and giddiness: Secondary | ICD-10-CM | POA: Diagnosis not present

## 2022-02-13 DIAGNOSIS — J3489 Other specified disorders of nose and nasal sinuses: Secondary | ICD-10-CM | POA: Diagnosis not present

## 2022-02-13 MED ORDER — MECLIZINE HCL 25 MG PO TABS: 25.0000 mg | ORAL_TABLET | Freq: Three times a day (TID) | ORAL | 0 refills | Status: DC | PRN

## 2022-02-13 MED ORDER — AZELASTINE HCL 0.1 % NA SOLN: 1.0000 | Freq: Two times a day (BID) | NASAL | 2 refills | Status: DC

## 2022-02-13 NOTE — ED Triage Notes (Signed)
Pt states her dizziness has been going on for about a week ? ?Pt states when she bends over or gets up she is dizzy ? ?Pt states her nose is runny uncontrollably and she has tried every allergy pill and nothing is working ?

## 2022-02-17 NOTE — ED Provider Notes (Signed)
?[facility] ? ? ? ?CSN: [id number] ?Arrival date & time: 02/13/22  1513 ? ? ?  ? ?History   ?Chief Complaint ?Chief Complaint  ?Patient presents with  ? Dizziness  ?  I think I may have vertigo - Entered by patient  ? ? ?HPI ?Hanaan Gancarz is a 56 y.o. female.  ? ?Presenting today with 1 week history of room spinning dizziness that occurs when she moves her head too quickly in 1 direction or the other or bends over and stands back up.  She denies visual change, nausea, vomiting, recent head injury, headache, ear pain.  History of vertigo.  She also states that her nose has been running like a faucet for a week or more now despite trying numerous different allergy pills.  Does not currently take any nasal sprays.  History of seasonal allergies. ? ? ?Past Medical History:  ?Diagnosis Date  ? Anxiety   ? Asthma   ? Depression   ? Fibromyalgia   ? Migraines   ? ? ?Patient Active Problem List  ? Diagnosis Date Noted  ? Rectal bleeding 12/03/2015  ? Constipation 12/03/2015  ? ? ?Past Surgical History:  ?Procedure Laterality Date  ? ABDOMINAL HYSTERECTOMY  1990s  ? endometriosis  ? BILATERAL OOPHORECTOMY    ? COLONOSCOPY    ? COLONOSCOPY N/A 12/24/2015  ? Procedure: COLONOSCOPY;  Surgeon: West Bali, MD;  [facility];  Service: Endoscopy;  Laterality: N/A;  100 - moved to 1:30 - office to notify - pt can't come earlier per office  ? ? ?OB History   ?No obstetric history on file. ?  ? ? ? ?Home Medications   ? ?Prior to Admission medications   ?Medication Sig Start Date End Date Taking? Authorizing Provider  ?azelastine (ASTELIN) 0.1 % nasal spray Place 1 spray into both nostrils 2 (two) times daily. Use in each nostril as directed 02/13/22  Yes Particia Nearing, PA-C  ?meclizine (ANTIVERT) 25 MG tablet Take 1 tablet (25 mg total) by mouth 3 (three) times daily as needed for dizziness. May cause drowsiness 02/13/22  Yes Particia Nearing, PA-C  ?albuterol [facility]) 108 (90 Base)  MCG/ACT inhaler Inhale into the lungs. 07/19/15   [provider]  ?busPIRone (BUSPAR) 10 MG tablet Take by mouth. 08/30/13   [provider]  ?cimetidine (TAGAMET) 400 MG tablet     [provider]  ?escitalopram (LEXAPRO) 20 MG tablet Take by mouth.    [provider]  ?gabapentin (NEURONTIN) 300 MG capsule Take 300 mg by mouth 3 (three) times daily.    [provider]  ?HYDROcodone-ibuprofen (VICOPROFEN) 7.5-200 MG tablet Take by mouth.    [provider]  ?hydrOXYzine (ATARAX/VISTARIL) 25 MG tablet Take by mouth. 04/21/14   [provider]  ?QULIPTA 60 MG TABS Take 1 tablet by mouth daily. 02/08/22   [provider]  ?tiZANidine (ZANAFLEX) 4 MG tablet Take by mouth.    [provider]  ?topiramate (TOPAMAX) 100 MG tablet Take by mouth.    [provider]  ? ? ?Family History ?Family History  ?Problem Relation Age of Onset  ? Colon cancer Neg Hx   ? Inflammatory bowel disease Neg Hx   ? Rheum arthritis Mother   ? Heart disease Paternal Grandfather   ? Heart disease Paternal Grandmother   ? ? ?Social History ?Social History  ? ?Tobacco Use  ? Smoking status: Former  ?  Packs/day: 1.00  ?  Years: 17.00  ?  Pack years: 17.00  ?  Types: Cigarettes  ?  Start date: 12/02/1997  ?Vaping Use  ? Vaping Use: Never used  ?Substance Use Topics  ? Alcohol use: No  ?  Alcohol/week: 0.0 standard drinks  ? Drug use: No  ? ? ? ?Allergies   ?Amoxicillin, Phenergan [promethazine hcl], and Tape ? ? ?Review of Systems ?Review of Systems ?Per HPI ? ?Physical Exam ?Triage Vital Signs ?ED Triage Vitals  ?Enc Vitals Group  ?   BP 02/13/22 1609 (!) 152/81  ?   Pulse Rate 02/13/22 1609 88  ?   Resp 02/13/22 1609 18  ?   Temp 02/13/22 1609 97.6 ?F (36.4 ?C)  ?   Temp Source 02/13/22 1609 Oral  ?   SpO2 02/13/22 1609 98 %  ?   Weight --   ?   Height --   ?   Head Circumference --   ?   Peak Flow --   ?   Pain Score 02/13/22 1607 0  ?   Pain Loc --   ?   Pain  Edu? --   ?   Excl. in GC? --   ? ?No data found. ? ?Updated Vital Signs ?BP (!) 152/81 (BP Location: Right Arm)   Pulse 88   Temp 97.6 ?F (36.4 ?C) (Oral)   Resp 18   SpO2 98%  ? ?Visual Acuity ?Right Eye Distance:   ?Left Eye Distance:   ?Bilateral Distance:   ? ?Right Eye Near:   ?Left Eye Near:    ?Bilateral Near:    ? ?Physical Exam ?Vitals and nursing note reviewed.  ?Constitutional:   ?   Appearance: Normal appearance. She is not ill-appearing.  ?HENT:  ?   Head: Atraumatic.  ?   Right Ear: Tympanic membrane normal.  ?   Left Ear: Tympanic membrane normal.  ?   Nose: Rhinorrhea present.  ?   Mouth/Throat:  ?   Mouth: Mucous membranes are moist.  ?   Pharynx: Oropharynx is clear.  ?Eyes:  ?   Extraocular Movements: Extraocular movements intact.  ?   Conjunctiva/sclera: Conjunctivae normal.  ?Cardiovascular:  ?   Rate and Rhythm: Normal rate and regular rhythm.  ?   Heart sounds: Normal heart sounds.  ?Pulmonary:  ?   Effort: Pulmonary effort is normal.  ?   Breath sounds: Normal breath sounds.  ?Musculoskeletal:     ?   General: Normal range of motion.  ?   Cervical back: Normal range of motion and neck supple.  ?Skin: ?   General: Skin is warm and dry.  ?Neurological:  ?   Mental Status: She is alert and oriented to person, place, and time.  ?Psychiatric:     ?   Mood and Affect: Mood normal.     ?   Thought Content: Thought content normal.     ?   Judgment: Judgment normal.  ? ? ? ?UC Treatments / Results  ?Labs ?(all labs ordered are listed, but only abnormal results are displayed) ?Labs Reviewed - No data to display ? ?EKG ? ? ?Radiology ?No results found. ? ?Procedures ?Procedures (including critical care time) ? ?Medications Ordered in UC ?Medications - No data to display ? ?Initial Impression / Assessment and Plan / UC Course  ?I have reviewed the triage vital signs and the nursing notes. ? ?Pertinent labs & imaging results that were available during my care of the patient were reviewed by  me and  considered in my medical decision making (see chart for details). ? ?  ? ?Vitals and exam overall reassuring, she declines labs, EKG and other work-up for dizziness today.  Do suspect vertigo given consistent symptoms and presentation.  Treat with meclizine, Astelin nasal spray for her rhinorrhea, continue antihistamines.  Return for acutely worsening symptoms. ? ?Final Clinical Impressions(s) / UC Diagnoses  ? ?Final diagnoses:  ?Vertigo  ?Rhinorrhea  ? ?Discharge Instructions   ?None ?  ? ?ED Prescriptions   ? ? Medication Sig Dispense Auth. Provider  ? meclizine (ANTIVERT) 25 MG tablet Take 1 tablet (25 mg total) by mouth 3 (three) times daily as needed for dizziness. May cause drowsiness 30 tablet Particia Nearing, [place]  ? azelastine (ASTELIN) 0.1 % nasal spray Place 1 spray into both nostrils 2 (two) times daily. Use in each nostril as directed 30 mL Particia Nearing, PA-C  ? ?  ? ?PDMP not reviewed this encounter. ?  ?Particia Nearing, PA-C ?02/17/22 2041 ? ?

## 2022-03-08 ENCOUNTER — Ambulatory Visit
Admission: RE | Admit: 2022-03-08 | Discharge: 2022-03-08 | Disposition: A | Discharge status: Home / Self Care | Payer: 59 | Source: Ambulatory Visit | Attending: Family Medicine | Admitting: Family Medicine

## 2022-03-08 VITALS — BP 110/73 | HR 89 | Temp 97.7°F | Resp 18

## 2022-03-08 DIAGNOSIS — J3089 Other allergic rhinitis: Secondary | ICD-10-CM

## 2022-03-08 DIAGNOSIS — R42 Dizziness and giddiness: Secondary | ICD-10-CM | POA: Diagnosis not present

## 2022-03-08 MED ORDER — CETIRIZINE HCL 10 MG PO TABS: 10.0000 mg | ORAL_TABLET | Freq: Two times a day (BID) | ORAL | 1 refills | Status: AC

## 2022-03-08 MED ORDER — AZELASTINE HCL 0.1 % NA SOLN: 1.0000 | Freq: Two times a day (BID) | NASAL | 2 refills | Status: DC

## 2022-03-08 MED ORDER — PREDNISONE 20 MG PO TABS: 40.0000 mg | ORAL_TABLET | Freq: Every day | ORAL | 0 refills | Status: DC

## 2022-03-08 NOTE — ED Provider Notes (Signed)
[facility]    CSN: [id number] Arrival date & time: 03/08/22  1052      History   Chief Complaint Chief Complaint  Patient presents with   Ear Injury    Not really ear injury but vertigo wasn't a choice. I was there previously for this & it hasn't got better unfortunately - Entered by patient    HPI Pamela Scott is a 56 y.o. female.   Presenting today with 1 month history of persistent positional vertigo symptoms.  She was seen and given meclizine, Astelin nasal spray which she took for a while but states it did not help so she stopped taking both of these.  She notes that it seems like the pressure is worse in her left ear and when she moves her head to the left her symptoms are worse.  Some nausea associated but no vomiting.  Denies visual change, mental status change, speech or swallowing change, numbness, tingling, decreased range of motion.   Past Medical History:  Diagnosis Date   Anxiety    Asthma    Depression    Fibromyalgia    Migraines     Patient Active Problem List   Diagnosis Date Noted   Rectal bleeding 12/03/2015   Constipation 12/03/2015    Past Surgical History:  Procedure Laterality Date   ABDOMINAL HYSTERECTOMY  1990s   endometriosis   BILATERAL OOPHORECTOMY     COLONOSCOPY     COLONOSCOPY N/A 12/24/2015   Procedure: COLONOSCOPY;  Surgeon: West Bali, MD;  Location: [facility];  Service: Endoscopy;  Laterality: N/A;  100 - moved to 1:30 - office to notify - pt can't come earlier per office    OB History   No obstetric history on file.      Home Medications    Prior to Admission medications   Medication Sig Start Date End Date Taking? Authorizing Provider  cetirizine (ZYRTEC ALLERGY) 10 MG tablet Take 1 tablet (10 mg total) by mouth 2 (two) times daily. 03/08/22  Yes Particia Nearing, PA-C  predniSONE (DELTASONE) 20 MG tablet Take 2 tablets (40 mg total) by mouth daily with breakfast. 03/08/22  Yes Particia Nearing, PA-C  albuterol [facility]) [phone] Base) MCG/ACT inhaler Inhale into the lungs. 07/19/15   [provider]  azelastine (ASTELIN) 0.1 % nasal spray Place 1 spray into both nostrils 2 (two) times daily. Use in each nostril as directed 03/08/22   Particia Nearing, PA-C  busPIRone (BUSPAR) 10 MG tablet Take by mouth. 08/30/13   [provider]  cimetidine (TAGAMET) 400 MG tablet     [provider]  escitalopram (LEXAPRO) 20 MG tablet Take by mouth.    [provider]  gabapentin (NEURONTIN) 300 MG capsule Take 300 mg by mouth 3 (three) times daily.    [provider]  HYDROcodone-ibuprofen (VICOPROFEN) 7.5-200 MG tablet Take by mouth.    [provider]  hydrOXYzine (ATARAX/VISTARIL) 25 MG tablet Take by mouth. 04/21/14   [provider]  meclizine (ANTIVERT) 25 MG tablet Take 1 tablet (25 mg total) by mouth 3 (three) times daily as needed for dizziness. May cause drowsiness 02/13/22   Particia Nearing, PA-C  QULIPTA 60 MG TABS Take 1 tablet by mouth daily. 02/08/22   [provider]  tiZANidine (ZANAFLEX) 4 MG tablet Take by mouth.    [provider]  topiramate (TOPAMAX) 100 MG tablet Take by mouth.    [provider]    Family History Family History  Problem Relation Age of Onset   Colon cancer Neg Hx    Inflammatory bowel disease Neg Hx    Rheum arthritis Mother    Heart disease Paternal Grandfather    Heart disease Paternal Grandmother     Social History Social History   Tobacco Use   Smoking status: Former    Packs/day: 1.00    Years: 17.00    Pack years: 17.00    Types: Cigarettes    Start date: 12/02/1997  Vaping Use   Vaping Use: Never used  Substance Use Topics   Alcohol use: No    Alcohol/week: 0.0 standard drinks   Drug use: No     Allergies   Amoxicillin, Phenergan [promethazine hcl], and Tape   Review of Systems Review of Systems Per HPI  Physical  Exam Triage Vital Signs ED Triage Vitals  Enc Vitals Group     BP 03/08/22 1113 110/73     Pulse Rate 03/08/22 1113 89     Resp 03/08/22 1113 18     Temp 03/08/22 1113 97.7 F (36.5 C)     Temp Source 03/08/22 1113 Oral     SpO2 03/08/22 1113 97 %     Weight --      Height --      Head Circumference --      Peak Flow --      Pain Score 03/08/22 1111 0     Pain Loc --      Pain Edu? --      Excl. in GC? --    No data found.  Updated Vital Signs BP 110/73 (BP Location: Right Arm)   Pulse 89   Temp 97.7 F (36.5 C) (Oral)   Resp 18   SpO2 97%   Visual Acuity Right Eye Distance:   Left Eye Distance:   Bilateral Distance:    Right Eye Near:   Left Eye Near:    Bilateral Near:     Physical Exam Vitals and nursing note reviewed.  Constitutional:      Appearance: Normal appearance. She is not ill-appearing.  HENT:     Head: Atraumatic.     Ears:     Comments: Moderate bilateral middle ear effusions    Nose: Rhinorrhea present.     Mouth/Throat:     Mouth: Mucous membranes are moist.     Pharynx: Oropharynx is clear.  Eyes:     Extraocular Movements: Extraocular movements intact.     Conjunctiva/sclera: Conjunctivae normal.  Cardiovascular:     Rate and Rhythm: Normal rate and regular rhythm.     Heart sounds: Normal heart sounds.  Pulmonary:     Effort: Pulmonary effort is normal.     Breath sounds: Normal breath sounds.  Musculoskeletal:        General: Normal range of motion.     Cervical back: Normal range of motion and neck supple.  Skin:    General: Skin is warm and dry.  Neurological:     General: No focal deficit present.     Mental Status: She is alert and oriented to person, place, and time.     Motor: No weakness.     Gait: Gait normal.  Psychiatric:        Mood and Affect: Mood normal.        Thought Content: Thought content normal.        Judgment: Judgment normal.  UC Treatments / Results  Labs (all labs ordered are listed, but  only abnormal results are displayed) Labs Reviewed - No data to display  EKG   Radiology No results found.  Procedures Procedures (including critical care time)  Medications Ordered in UC Medications - No data to display  Initial Impression / Assessment and Plan / UC Course  I have reviewed the triage vital signs and the nursing notes.  Pertinent labs & imaging results that were available during my care of the patient were reviewed by me and considered in my medical decision making (see chart for details).     Treat with prednisone, Zyrtec, Astelin nasal spray.  Suspect at least partially from uncontrolled allergies but will refer to ENT given persistence of symptoms and no improvement with Epley maneuvers and supportive measures at home.  Follow-up for worsening symptoms.  Final Clinical Impressions(s) / UC Diagnoses   Final diagnoses:  Vertigo  Seasonal allergic rhinitis due to other allergic trigger   Discharge Instructions   None    ED Prescriptions     Medication Sig Dispense Auth. Provider   azelastine (ASTELIN) 0.1 % nasal spray Place 1 spray into both nostrils 2 (two) times daily. Use in each nostril as directed 30 mL Particia Nearing, PA-C   cetirizine (ZYRTEC ALLERGY) 10 MG tablet Take 1 tablet (10 mg total) by mouth 2 (two) times daily. 60 tablet Particia Nearing, [place]   predniSONE (DELTASONE) 20 MG tablet Take 2 tablets (40 mg total) by mouth daily with breakfast. 10 tablet Particia Nearing, [place]      PDMP not reviewed this encounter.   Particia Nearing, [place] 03/08/22 1142

## 2022-03-08 NOTE — ED Triage Notes (Signed)
Pt states she was here almost a month ago and she still has Vertigo and it is worse  Pt states she is dizzy if she stands up, lays down, sits down or changes positions

## 2022-06-27 ENCOUNTER — Ambulatory Visit
Admission: RE | Admit: 2022-06-27 | Discharge: 2022-06-27 | Disposition: A | Discharge status: Home / Self Care | Payer: 59 | Source: Ambulatory Visit | Attending: Family Medicine | Admitting: Family Medicine

## 2022-06-27 VITALS — BP 137/85 | HR 90 | Temp 97.5°F | Resp 18

## 2022-06-27 DIAGNOSIS — J4521 Mild intermittent asthma with (acute) exacerbation: Secondary | ICD-10-CM | POA: Diagnosis not present

## 2022-06-27 DIAGNOSIS — J22 Unspecified acute lower respiratory infection: Secondary | ICD-10-CM | POA: Diagnosis not present

## 2022-06-27 MED ORDER — METHYLPREDNISOLONE SODIUM SUCC 125 MG IJ SOLR
80.0000 mg | Freq: Once | INTRAMUSCULAR | Status: AC
  Administered 2022-06-27: 80 mg via INTRAMUSCULAR

## 2022-06-27 MED ORDER — AZITHROMYCIN 250 MG PO TABS: ORAL_TABLET | ORAL | 0 refills | Status: DC

## 2022-06-27 MED ORDER — ALBUTEROL SULFATE (2.5 MG/3ML) 0.083% IN NEBU
2.5000 mg | INHALATION_SOLUTION | Freq: Once | RESPIRATORY_TRACT | Status: AC
  Administered 2022-06-27: 2.5 mg via RESPIRATORY_TRACT

## 2022-06-27 MED ORDER — ALBUTEROL SULFATE HFA 108 (90 BASE) MCG/ACT IN AERS: 1.0000 | INHALATION_SPRAY | Freq: Four times a day (QID) | RESPIRATORY_TRACT | 0 refills | Status: DC | PRN

## 2022-06-27 NOTE — ED Triage Notes (Signed)
Yellow nasal congestion and productive cough with green sputum since Thursday last week.  Has been taking day quil and over the counter cough medication without relief.

## 2022-06-27 NOTE — ED Provider Notes (Signed)
[facility]    CSN: [id number] Arrival Scott & time: 06/27/22  1431      History   Chief Complaint Chief Complaint  Patient presents with   Cough    Bronchitis with congestion in chest & nose. Had over a week. Yellow & green discharge. Feel awful & not feeling any better - Entered by patient    HPI Pamela Scott is a 56 y.o. female.   Patient presenting today with 1 and half weeks of progressively worsening nasal congestion, productive hacking cough, chest tightness, wheezing, occasional shortness of breath.  Denies fever, chills, body aches, chest pain, abdominal pain, nausea vomiting or diarrhea.  Has department taking cold and congestion medication with minimal relief.  She notes a history of asthma that typically only flares up when she gets sick.  Does not currently have an inhaler at home.    Past Medical History:  Diagnosis Scott   Anxiety    Asthma    Depression    Fibromyalgia    Migraines     Patient Active Problem List   Diagnosis Scott Noted   Rectal bleeding 12/03/2015   Constipation 12/03/2015    Past Surgical History:  Procedure Laterality Scott   ABDOMINAL HYSTERECTOMY  1990s   endometriosis   BILATERAL OOPHORECTOMY     COLONOSCOPY     COLONOSCOPY N/A 12/24/2015   Procedure: COLONOSCOPY;  Surgeon: West Bali, MD;  Location: [facility];  Service: Endoscopy;  Laterality: N/A;  100 - moved to 1:30 - office to notify - pt can't come earlier per office    OB History   No obstetric history on file.      Home Medications    Prior to Admission medications   Medication Sig Start Scott End Scott Taking? Authorizing Provider  albuterol (VENTOLIN HFA) 108 (90 Base) MCG/ACT inhaler Inhale 1-2 puffs into the lungs every 6 (six) hours as needed for wheezing or shortness of breath. 06/27/22  Yes Particia Nearing, PA-C  azithromycin (ZITHROMAX) 250 MG tablet Take first 2 tablets together, then 1 every day until finished. 06/27/22  Yes  Particia Nearing, PA-C  albuterol [facility]) 108 [phone] Base) MCG/ACT inhaler Inhale into the lungs. 07/19/15   [provider]  azelastine (ASTELIN) 0.1 % nasal spray Place 1 spray into both nostrils 2 (two) times daily. Use in each nostril as directed 03/08/22   Particia Nearing, PA-C  busPIRone (BUSPAR) 10 MG tablet Take by mouth. 08/30/13   [provider]  cetirizine (ZYRTEC ALLERGY) 10 MG tablet Take 1 tablet (10 mg total) by mouth 2 (two) times daily. 03/08/22   Particia Nearing, PA-C  cimetidine (TAGAMET) 400 MG tablet     [provider]  escitalopram (LEXAPRO) 20 MG tablet Take by mouth.    [provider]  gabapentin (NEURONTIN) 300 MG capsule Take 300 mg by mouth 3 (three) times daily.    [provider]  HYDROcodone-ibuprofen (VICOPROFEN) 7.5-200 MG tablet Take by mouth.    [provider]  hydrOXYzine (ATARAX/VISTARIL) 25 MG tablet Take by mouth. 04/21/14   [provider]  meclizine (ANTIVERT) 25 MG tablet Take 1 tablet (25 mg total) by mouth 3 (three) times daily as needed for dizziness. May cause drowsiness 02/13/22   Particia Nearing, PA-C  predniSONE (DELTASONE) 20 MG tablet Take 2 tablets (40 mg total) by mouth daily with breakfast. 03/08/22   Particia Nearing, PA-C  QULIPTA 60 MG TABS Take  1 tablet by mouth daily. 02/08/22   [provider]  tiZANidine (ZANAFLEX) 4 MG tablet Take by mouth.    [provider]  topiramate (TOPAMAX) 100 MG tablet Take by mouth.    [provider]    Family History Family History  Problem Relation Age of Onset   Colon cancer Neg Hx    Inflammatory bowel disease Neg Hx    Rheum arthritis Mother    Heart disease Paternal Grandfather    Heart disease Paternal Grandmother     Social History Social History   Tobacco Use   Smoking status: Former    Packs/day: 1.00    Years: 17.00    Total pack years: 17.00    Types: Cigarettes     Start Scott: 12/02/1997  Vaping Use   Vaping Use: Never used  Substance Use Topics   Alcohol use: No    Alcohol/week: 0.0 standard drinks of alcohol   Drug use: No     Allergies   Amoxicillin, Phenergan [promethazine hcl], and Tape   Review of Systems Review of Systems PER HPI  Physical Exam Triage Vital Signs ED Triage Vitals  Enc Vitals Group     BP 06/27/22 1504 137/85     Pulse Rate 06/27/22 1504 90     Resp 06/27/22 1504 18     Temp 06/27/22 1504 (!) 97.5 F (36.4 C)     Temp Source 06/27/22 1504 Oral     SpO2 06/27/22 1504 97 %     Weight --      Height --      Head Circumference --      Peak Flow --      Pain Score 06/27/22 1505 3     Pain Loc --      Pain Edu? --      Excl. in [place]? --    No data found.  Updated Vital Signs BP 137/85 (BP Location: Right Arm)   Pulse 90   Temp (!) 97.5 F (36.4 C) (Oral)   Resp 18   SpO2 97%   Visual Acuity Right Eye Distance:   Left Eye Distance:   Bilateral Distance:    Right Eye Near:   Left Eye Near:    Bilateral Near:     Physical Exam Vitals and nursing note reviewed.  Constitutional:      Appearance: Normal appearance.  HENT:     Head: Atraumatic.     Right Ear: Tympanic membrane and external ear normal.     Left Ear: Tympanic membrane and external ear normal.     Nose: Congestion present.     Mouth/Throat:     Mouth: Mucous membranes are moist.     Pharynx: Posterior oropharyngeal erythema present.  Eyes:     Extraocular Movements: Extraocular movements intact.     Conjunctiva/sclera: Conjunctivae normal.  Cardiovascular:     Rate and Rhythm: Normal rate and regular rhythm.     Heart sounds: Normal heart sounds.  Pulmonary:     Effort: Pulmonary effort is normal.     Breath sounds: Wheezing present.     Comments: Mild wheezes b/l Musculoskeletal:        General: Normal range of motion.     Cervical back: Normal range of motion and neck supple.  Skin:    General: Skin is warm and dry.   Neurological:     Mental Status: She is alert and oriented to person, place, and time.  Psychiatric:  Mood and Affect: Mood normal.        Thought Content: Thought content normal.      UC Treatments / Results  Labs (all labs ordered are listed, but only abnormal results are displayed) Labs Reviewed - No data to display  EKG   Radiology No results found.  Procedures Procedures (including critical care time)  Medications Ordered in UC Medications  albuterol (PROVENTIL) (2.5 MG/3ML) 0.083% nebulizer solution 2.5 mg (2.5 mg Nebulization Given 06/27/22 1530)  methylPREDNISolone sodium succinate (SOLU-MEDROL) 125 mg/2 mL injection 80 mg (80 mg Intramuscular Given 06/27/22 1530)    Initial Impression / Assessment and Plan / UC Course  I have reviewed the triage vital signs and the nursing notes.  Pertinent labs & imaging results that were available during my care of the patient were reviewed by me and considered in my medical decision making (see chart for details).     Given duration worsening course of symptoms, start azithromycin, IM Solu-Medrol, albuterol inhaler.  Albuterol neb treatment given in clinic with moderate improvement in chest tightness, wheezing.  Discussed supportive over-the-counter medications and home care.  Return for worsening symptoms.  Final Clinical Impressions(s) / UC Diagnoses   Final diagnoses:  Lower respiratory infection  Mild intermittent asthma with acute exacerbation   Discharge Instructions   None    ED Prescriptions     Medication Sig Dispense Auth. Provider   azithromycin (ZITHROMAX) 250 MG tablet Take first 2 tablets together, then 1 every day until finished. 6 tablet Particia Nearing, [place]   albuterol (VENTOLIN HFA) 108 (90 Base) MCG/ACT inhaler Inhale 1-2 puffs into the lungs every 6 (six) hours as needed for wheezing or shortness of breath. 18 g Particia Nearing, [place]      PDMP not reviewed this encounter.    Particia Nearing, [place] 06/27/22 [phone]

## 2022-07-05 ENCOUNTER — Other Ambulatory Visit: Payer: Self-pay | Admitting: Family Medicine

## 2022-07-07 NOTE — Telephone Encounter (Signed)
Requested Prescriptions  Pending Prescriptions Disp Refills  . azithromycin (ZITHROMAX) 250 MG tablet [Pharmacy Med Name: Azithromycin 250 MG Oral Tablet] 6 tablet 0    Sig: TAKE 2 TABLETS BY MOUTH ON DAY 1, AND THEN TAKE 1 TABLET BY MOUTH ONCE A DAY ON DAY 2 THROUGH DAY 5     There is no refill protocol information for this order

## 2023-04-16 DIAGNOSIS — M5417 Radiculopathy, lumbosacral region: Secondary | ICD-10-CM | POA: Diagnosis not present

## 2023-04-16 DIAGNOSIS — G43119 Migraine with aura, intractable, without status migrainosus: Secondary | ICD-10-CM | POA: Diagnosis not present

## 2023-04-16 DIAGNOSIS — W19XXXA Unspecified fall, initial encounter: Secondary | ICD-10-CM | POA: Diagnosis not present

## 2023-04-16 DIAGNOSIS — F419 Anxiety disorder, unspecified: Secondary | ICD-10-CM | POA: Diagnosis not present

## 2023-04-17 ENCOUNTER — Ambulatory Visit
Admission: EM | Admit: 2023-04-17 | Discharge: 2023-04-17 | Disposition: A | Discharge status: Home / Self Care | Payer: 59 | Attending: Family Medicine | Admitting: Family Medicine

## 2023-04-17 ENCOUNTER — Ambulatory Visit (INDEPENDENT_AMBULATORY_CARE_PROVIDER_SITE_OTHER): Payer: 59

## 2023-04-17 ENCOUNTER — Encounter: Payer: Self-pay | Admitting: Emergency Medicine

## 2023-04-17 DIAGNOSIS — M25562 Pain in left knee: Secondary | ICD-10-CM | POA: Diagnosis not present

## 2023-04-17 DIAGNOSIS — S8002XA Contusion of left knee, initial encounter: Secondary | ICD-10-CM

## 2023-04-17 DIAGNOSIS — M7989 Other specified soft tissue disorders: Secondary | ICD-10-CM | POA: Diagnosis not present

## 2023-04-17 NOTE — Discharge Instructions (Signed)
RICE protocol, ibuprofen and Tylenol as needed for pain

## 2023-04-17 NOTE — ED Triage Notes (Signed)
Fell on Wednesday and hit left knee.  Bruising and swelling to left knee.  Has been taking ibuprofen for pain.

## 2023-04-17 NOTE — ED Provider Notes (Signed)
[facility]    CSN: [id number] Arrival date & time: 04/17/23  1012      History   Chief Complaint No chief complaint on file.   HPI Pamela Scott is a 57 y.o. female.   Patient presenting today with 2-day history of left anterior knee pain, bruising, swelling after falling directly down onto the knee.  Denies loss of range of motion, head injury during the fall or other areas of pain, numbness, tingling, weakness.  Taking ibuprofen and using RICE protocol with mild temporary benefit.  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    Past Medical History:  Diagnosis Date   Anxiety    Asthma    Depression    Fibromyalgia    Migraines     Patient Active Problem List   Diagnosis Date Noted   Rectal bleeding 12/03/2015   Constipation 12/03/2015    Past Surgical History:  Procedure Laterality Date   ABDOMINAL HYSTERECTOMY  1990s   endometriosis   BILATERAL OOPHORECTOMY     COLONOSCOPY     COLONOSCOPY N/A 12/24/2015   Procedure: COLONOSCOPY;  Surgeon: West Bali, MD;  Location: [facility];  Service: Endoscopy;  Laterality: N/A;  100 - moved to 1:30 - office to notify - pt can't come earlier per office    OB History   No obstetric history on file.      Home Medications    Prior to Admission medications   Medication Sig Start Date End Date Taking? Authorizing Provider  albuterol (PROAIR HFA) 108 (90 Base) MCG/ACT inhaler Inhale into the lungs. 07/19/15   [provider]  albuterol (VENTOLIN HFA) 108 (90 Base) MCG/ACT inhaler Inhale 1-2 puffs into the lungs every 6 (six) hours as needed for wheezing or shortness of breath. 06/27/22   Particia Nearing, PA-C  azelastine (ASTELIN) 0.1 % nasal spray Place 1 spray into both nostrils 2 (two) times daily. Use in each nostril as directed 03/08/22   Particia Nearing, PA-C  azithromycin (ZITHROMAX) 250 MG tablet Take first 2  tablets together, then 1 every day until finished. 06/27/22   Particia Nearing, PA-C  busPIRone (BUSPAR) 10 MG tablet Take by mouth. 08/30/13   [provider]  cetirizine (ZYRTEC ALLERGY) 10 MG tablet Take 1 tablet (10 mg total) by mouth 2 (two) times daily. 03/08/22   Particia Nearing, PA-C  cimetidine (TAGAMET) 400 MG tablet     [provider]  escitalopram (LEXAPRO) 20 MG tablet Take by mouth.    [provider]  gabapentin (NEURONTIN) 300 MG capsule Take 300 mg by mouth 3 (three) times daily.    [provider]  HYDROcodone-ibuprofen (VICOPROFEN) 7.5-200 MG tablet Take by mouth.    [provider]  hydrOXYzine (ATARAX/VISTARIL) 25 MG tablet Take by mouth. 04/21/14   [provider]  meclizine (ANTIVERT) 25 MG tablet Take 1 tablet (25 mg total) by mouth 3 (three) times daily as needed for dizziness. May cause drowsiness 02/13/22   Particia Nearing, PA-C  predniSONE (DELTASONE) 20 MG tablet Take 2 tablets (40 mg total) by mouth daily with breakfast. 03/08/22   Particia Nearing, PA-C  QULIPTA 60 MG TABS Take 1 tablet by mouth daily. 02/08/22   [provider]  tiZANidine (ZANAFLEX) 4 MG tablet Take by mouth.    [provider]  topiramate (TOPAMAX) 100 MG tablet Take by mouth.  [provider]    Family History Family History  Problem Relation Age of Onset   Colon cancer Neg Hx    Inflammatory bowel disease Neg Hx    Rheum arthritis Mother    Heart disease Paternal Grandfather    Heart disease Paternal Grandmother     Social History Social History   Tobacco Use   Smoking status: Former    Current packs/day: 1.00    Average packs/day: 1 pack/day for 25.4 years (25.4 ttl pk-yrs)    Types: Cigarettes    Start date: 12/02/1997  Vaping Use   Vaping status: Never Used  Substance Use Topics   Alcohol use: No    Alcohol/week: 0.0 standard drinks of alcohol   Drug use: No      Allergies   Amoxicillin, Phenergan [promethazine hcl], and Tape   Review of Systems Review of Systems Per HPI  Physical Exam Triage Vital Signs ED Triage Vitals  Encounter Vitals Group     BP 04/17/23 1022 115/75     Systolic BP Percentile --      Diastolic BP Percentile --      Pulse Rate 04/17/23 1022 93     Resp 04/17/23 1022 16     Temp 04/17/23 1027 (!) 97.2 F (36.2 C)     Temp Source 04/17/23 1027 Temporal     SpO2 04/17/23 1022 99 %     Weight --      Height --      Head Circumference --      Peak Flow --      Pain Score 04/17/23 1024 4     Pain Loc --      Pain Education --      Exclude from Growth Chart --    No data found.  Updated Vital Signs BP 115/75 (BP Location: Right Arm)   Pulse 93   Temp (!) 97.2 F (36.2 C) (Temporal)   Resp 16   SpO2 99%   Visual Acuity Right Eye Distance:   Left Eye Distance:   Bilateral Distance:    Right Eye Near:   Left Eye Near:    Bilateral Near:     Physical Exam Vitals and nursing note reviewed.  Constitutional:      Appearance: Normal appearance. She is not ill-appearing.  HENT:     Head: Atraumatic.  Eyes:     Extraocular Movements: Extraocular movements intact.     Conjunctiva/sclera: Conjunctivae normal.  Cardiovascular:     Rate and Rhythm: Normal rate and regular rhythm.     Heart sounds: Normal heart sounds.  Pulmonary:     Effort: Pulmonary effort is normal.     Breath sounds: Normal breath sounds.  Musculoskeletal:        General: Swelling, tenderness and signs of injury present. No deformity. Normal range of motion.     Cervical back: Normal range of motion and neck supple.     Comments: Diffuse bruising, edema, tenderness to palpation left anterior knee.  Negative drawer testing, good joint stability and normal range of motion  Skin:    General: Skin is warm and dry.     Findings: Bruising present.  Neurological:     Mental Status: She is alert and oriented to person, place, and  time.     Comments: Left lower extremity neurovascularly intact  Psychiatric:        Mood and Affect: Mood normal.        Thought Content: Thought content  normal.        Judgment: Judgment normal.      UC Treatments / Results  Labs (all labs ordered are listed, but only abnormal results are displayed) Labs Reviewed - No data to display  EKG   Radiology DG Knee Complete 4 Views Left  Result Date: 04/17/2023 CLINICAL DATA:  Fall, left knee pain and swelling EXAM: LEFT KNEE - COMPLETE 4+ VIEW COMPARISON:  None Available. FINDINGS: No evidence of fracture, dislocation, or joint effusion. No evidence of arthropathy or other focal bone abnormality. Prominent prepatellar soft tissue swelling. IMPRESSION: Prominent prepatellar soft tissue swelling. No acute fracture or dislocation of the left knee. Electronically Signed   By: Duanne Guess D.O.   On: 04/17/2023 10:53    Procedures Procedures (including critical care time)  Medications Ordered in UC Medications - No data to display  Initial Impression / Assessment and Plan / UC Course  I have reviewed the triage vital signs and the nursing notes.  Pertinent labs & imaging results that were available during my care of the patient were reviewed by me and considered in my medical decision making (see chart for details).     X-ray of the left knee negative for acute bony abnormality.  Exam very reassuring today.  Continue RICE protocol, ibuprofen and note given for rest over the next few days.  Final Clinical Impressions(s) / UC Diagnoses   Final diagnoses:  Contusion of left knee, initial encounter     Discharge Instructions      RICE protocol, ibuprofen and Tylenol as needed for pain    ED Prescriptions   None    PDMP not reviewed this encounter.   Particia Nearing, [place] 04/17/23 1214

## 2023-08-17 DIAGNOSIS — Z008 Encounter for other general examination: Secondary | ICD-10-CM | POA: Diagnosis not present

## 2023-12-03 DIAGNOSIS — G8929 Other chronic pain: Secondary | ICD-10-CM | POA: Diagnosis not present

## 2023-12-03 DIAGNOSIS — M542 Cervicalgia: Secondary | ICD-10-CM | POA: Diagnosis not present

## 2023-12-03 DIAGNOSIS — M545 Low back pain, unspecified: Secondary | ICD-10-CM | POA: Diagnosis not present

## 2023-12-03 DIAGNOSIS — M2550 Pain in unspecified joint: Secondary | ICD-10-CM | POA: Diagnosis not present

## 2023-12-03 DIAGNOSIS — G43909 Migraine, unspecified, not intractable, without status migrainosus: Secondary | ICD-10-CM | POA: Diagnosis not present

## 2023-12-08 ENCOUNTER — Other Ambulatory Visit (HOSPITAL_COMMUNITY): Payer: Self-pay | Admitting: Nurse Practitioner

## 2023-12-08 ENCOUNTER — Ambulatory Visit (HOSPITAL_COMMUNITY)
Admission: RE | Admit: 2023-12-08 | Discharge: 2023-12-08 | Disposition: A | Discharge status: Home / Self Care | Source: Ambulatory Visit | Attending: Nurse Practitioner | Admitting: Nurse Practitioner

## 2023-12-08 DIAGNOSIS — M542 Cervicalgia: Secondary | ICD-10-CM | POA: Insufficient documentation

## 2023-12-08 DIAGNOSIS — M545 Low back pain, unspecified: Secondary | ICD-10-CM | POA: Diagnosis not present

## 2023-12-08 DIAGNOSIS — M47812 Spondylosis without myelopathy or radiculopathy, cervical region: Secondary | ICD-10-CM | POA: Diagnosis not present

## 2023-12-08 DIAGNOSIS — M546 Pain in thoracic spine: Secondary | ICD-10-CM | POA: Diagnosis not present

## 2023-12-29 ENCOUNTER — Ambulatory Visit: Payer: Self-pay | Admitting: Family Medicine

## 2023-12-31 DIAGNOSIS — M47816 Spondylosis without myelopathy or radiculopathy, lumbar region: Secondary | ICD-10-CM | POA: Diagnosis not present

## 2023-12-31 DIAGNOSIS — Z79891 Long term (current) use of opiate analgesic: Secondary | ICD-10-CM | POA: Diagnosis not present

## 2023-12-31 DIAGNOSIS — M545 Low back pain, unspecified: Secondary | ICD-10-CM | POA: Diagnosis not present

## 2023-12-31 DIAGNOSIS — G8929 Other chronic pain: Secondary | ICD-10-CM | POA: Diagnosis not present

## 2023-12-31 DIAGNOSIS — M2550 Pain in unspecified joint: Secondary | ICD-10-CM | POA: Diagnosis not present

## 2024-01-04 ENCOUNTER — Ambulatory Visit (HOSPITAL_COMMUNITY): Attending: Nurse Practitioner

## 2024-01-04 ENCOUNTER — Encounter (HOSPITAL_COMMUNITY): Payer: Self-pay

## 2024-01-04 ENCOUNTER — Other Ambulatory Visit: Payer: Self-pay

## 2024-01-04 DIAGNOSIS — M6281 Muscle weakness (generalized): Secondary | ICD-10-CM | POA: Insufficient documentation

## 2024-01-04 DIAGNOSIS — M542 Cervicalgia: Secondary | ICD-10-CM | POA: Diagnosis not present

## 2024-01-04 DIAGNOSIS — G8929 Other chronic pain: Secondary | ICD-10-CM | POA: Insufficient documentation

## 2024-01-04 DIAGNOSIS — M549 Dorsalgia, unspecified: Secondary | ICD-10-CM | POA: Insufficient documentation

## 2024-01-04 NOTE — Therapy (Signed)
 OUTPATIENT PHYSICAL THERAPY THORACOLUMBAR EVALUATION   Patient Name: Pamela Scott MRN: [id number] DOB:1966-03-23, 58 y.o., female Today's Date: 01/04/2024  END OF SESSION:  PT End of Session - 01/04/24 1439     Visit Number 1    Date for PT Re-Evaluation 02/29/24    Authorization Type Medicaid [employer]    Authorization Time Period seeking new auth    PT Start Time 1351    PT Stop Time 1432    PT Time Calculation (min) 41 min    Behavior During Therapy WFL for tasks assessed/performed             Past Medical History:  Diagnosis Date   Anxiety    Asthma    Depression    Fibromyalgia    Migraines    Past Surgical History:  Procedure Laterality Date   ABDOMINAL HYSTERECTOMY  1990s   endometriosis   BILATERAL OOPHORECTOMY     COLONOSCOPY     COLONOSCOPY N/A 12/24/2015   Procedure: COLONOSCOPY;  Surgeon: West Bali, MD;  Location: [facility];  Service: Endoscopy;  Laterality: N/A;  100 - moved to 1:30 - office to notify - pt can't come earlier per office   Patient Active Problem List   Diagnosis Date Noted   Rectal bleeding 12/03/2015   Constipation 12/03/2015    PCP: Kathlee Nations, MD  REFERRING PROVIDER: Janeece Riggers, FNP  REFERRING DIAG: Chronic neck and back pain  Rationale for Evaluation and Treatment: Rehabilitation  THERAPY DIAG:  Chronic neck and back pain  Muscle weakness (generalized)  ONSET DATE: > 10+ years  SUBJECTIVE:                                                                                                                                                                                           SUBJECTIVE STATEMENT: Pt reporting chronic low back pain for 10 years. 8/10 years pt has been caring for niece who is a dependent individual with various syndromes. Pt is reporting that her back specialist is going to take injections on her low back. Prior to becoming caregiver, pt was working on call center, primarily sitting  position. Pt lives with niece, and lifts her 4-5 times a day. Worst pain is 8/10. Pt has total back pain from cervical spine to lumbar spine into sacrum. Pt reports she has a negative experience.   PERTINENT HISTORY:  Fibromyalgia Depression  PAIN:  Are you having pain? Yes: NPRS scale: 5/10 Pain location: Low back primarily and intermittent radiation from neck into shoulders Pain description: stabbing and radiating-pt showing radiation pattern of sacrum Aggravating factors: Lifting, standing, work  Relieving factors: Rest  PRECAUTIONS: None  RED FLAGS: None   WEIGHT BEARING RESTRICTIONS: No  FALLS:  Has patient fallen in last 6 months? No   PATIENT GOALS: "I would like to not hurt as much."  OBJECTIVE:  Note: Objective measures were completed at Evaluation unless otherwise noted.  DIAGNOSTIC FINDINGS:    PATIENT SURVEYS:  Modified Oswestry 12/50 = 24%   COGNITION: Overall cognitive status: Within functional limits for tasks assessed     SENSATION: WFL  POSTURE: rounded shoulders, forward head, and decreased lumbar lordosis  PALPATION: Hypermobile CPAs from L1-L5- TTP at sacrum and PSIS  LUMBAR ROM:   AROM eval  Flexion Toes to feet- no pain  Extension 100% painful  Right lateral flexion 100%  Left lateral flexion 100%  Right rotation 100% *  Left rotation 100% *   (Blank rows = not tested)  LOWER EXTREMITY ROM:     Active  Right eval Left eval  Hip flexion    Hip extension    Hip abduction    Hip adduction    Hip internal rotation    Hip external rotation    Knee flexion    Knee extension    Ankle dorsiflexion    Ankle plantarflexion    Ankle inversion    Ankle eversion     (Blank rows = not tested)  LOWER EXTREMITY MMT:    MMT Right eval Left eval  Hip flexion 4 4  Hip extension 4-* 4-*  Hip abduction 3+* 3+*  Hip adduction    Hip internal rotation    Hip external rotation    Knee flexion    Knee extension    Ankle dorsiflexion     Ankle plantarflexion    Ankle inversion    Ankle eversion     (Blank rows = not tested)  LUMBAR SPECIAL TESTS:  Prone instability test: Positive, Straight leg raise test: Negative, and SI Compression/distraction test: Positive GAIT: Distance walked: 1ft Assistive device utilized: None Level of assistance: Complete Independence Comments: increased anterior trunk posterior with mild posterior pelvic tilt and mild trendelenberg.   TREATMENT DATE:    01/04/2024 Evaluation                                                                                                                                 PATIENT EDUCATION:  Education details: PT Evaluation, findings, prognosis, frequency, attendance policy, and HEP. Person educated: Patient Education method: Medical illustrator Education comprehension: verbalized understanding  HOME EXERCISE PROGRAM: Access Code: [email or web address] D URL: [email or web address] Date: 01/04/2024 Prepared by: Starling Manns  Exercises - Supine Bridge with Resistance Band  - 1 x daily - 7 x weekly - 3 sets - 12-15 reps - 5 hold - Clamshell with Resistance  - 1 x daily - 7 x weekly - 3 sets - 12-15 reps - 5 hold - Prone Alternating Arm and Leg Lifts  - 1 x daily - 7  x weekly - 3 sets - 10 reps - Prone Scapular Slide with Shoulder Extension  - 1 x daily - 7 x weekly - 3 sets - 10 reps  ASSESSMENT:  CLINICAL IMPRESSION: Patient is a 58 y.o. 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 Pt is performing dependent lifts on niece 4-5x daily. Pt demonstrating BLE and gluteal muscle weakness which is limiting her spinal stability and capacity to perform functional lifts/transfers in pain free fashion and increasing risk of injury.  Evaluation shows hypermobility of Lumbar spine and painful with repeated motion. Pt's main functional limitations in ADLs and required job duties are related to spinal instability in setting of muscle weakness. Pt will benefit from skilled Physical Therapy services to address deficits/limitations in order to improve functional and QOL.   OBJECTIVE IMPAIRMENTS: difficulty walking, decreased strength, impaired perceived functional ability, increased muscle spasms, improper body mechanics, postural dysfunction, and pain.   ACTIVITY LIMITATIONS: carrying, lifting, bending, sitting, standing, squatting, reach over head, hygiene/grooming, locomotion level, and caring for others  PARTICIPATION LIMITATIONS: cleaning, laundry, community activity, occupation, and ADLs and transfers for pt's niece  PERSONAL FACTORS: Age are also affecting patient's functional outcome.   REHAB POTENTIAL: Excellent  CLINICAL DECISION MAKING: Stable/uncomplicated  EVALUATION COMPLEXITY: Low   GOALS: Goals reviewed with patient? No  SHORT TERM GOALS: Target date: 02/01/2024  Pt will be independent with HEP in order to demonstrate participation in Physical Therapy POC.  Baseline: Goal status: INITIAL  2.  Pt will report 3/10 pain with mobility/functional transfers in order to demonstrate improved pain with ADLs.  Baseline:  Goal status: INITIAL  LONG TERM GOALS: Target date: 02/29/2024  Pt will report 4/10 pain in 5/7 days of the week while transferring niecein order to demonstrate improved functional strength to return to desired activities.  Baseline: see objective.  Goal status: INITIAL  2.  Pt will improve BLE MMT by at least 1 grade in order to maintain proper lifting posture, reduce risk of injury and reduce pain during functional transfers for niece. .  Baseline: see objective.  Goal status: INITIAL  3.  Pt will improve Modified Oswestry score by 8 points in order to demonstrate improved pain with  functional goals and outcomes. Baseline: see objective.  Goal status: INITIAL  4.  Pt will report 0/10 pain with mobility in order to demonstrate reduced pain with ADLs lasting greater than 30 minutes.  Baseline: see objective.  Goal status: INITIAL  PLAN:  PT FREQUENCY: 1x/week  PT DURATION: 8 weeks  PLANNED INTERVENTIONS: 97164- PT Re-evaluation, 97110-Therapeutic exercises, 97530- Therapeutic activity, [id number]- Neuromuscular re-education, 97535- Self Care, 16109- Manual therapy, [id number]- Gait training, [phone]- Electrical stimulation (unattended), [phone]- Electrical stimulation (manual), Patient/Family education, DME instructions, Cryotherapy, and Moist heat.  PLAN FOR NEXT SESSION: Advance HEP, postural strengthening, practice transferring pt's niece, maintain spinal stability throughout transfer practice.   Elie Goody, DPT [facility] [place] [phone] office    Managed Medicaid Authorization Request  Visit Dx Codes: M54.2,M54.9,G89.29  m62.81  Functional Tool Score: Modified Oswestry: 12/50 = 24%  For all possible CPT codes, reference the Planned Interventions line above.     Check all conditions that are expected to impact treatment: {Conditions expected to impact treatment:None of these apply  If treatment provided at initial evaluation, no treatment charged due to lack of authorization.     Nelida Meuse, PT 01/04/2024, 2:40 PM

## 2024-01-09 ENCOUNTER — Telehealth: Admitting: Nurse Practitioner

## 2024-01-09 DIAGNOSIS — J45909 Unspecified asthma, uncomplicated: Secondary | ICD-10-CM | POA: Diagnosis not present

## 2024-01-09 MED ORDER — IPRATROPIUM BROMIDE 0.03 % NA SOLN: 2.0000 | Freq: Two times a day (BID) | NASAL | 0 refills | Status: DC

## 2024-01-09 MED ORDER — ALBUTEROL SULFATE HFA 108 (90 BASE) MCG/ACT IN AERS: 1.0000 | INHALATION_SPRAY | Freq: Four times a day (QID) | RESPIRATORY_TRACT | 0 refills | Status: DC | PRN

## 2024-01-09 MED ORDER — PREDNISONE 20 MG PO TABS: 40.0000 mg | ORAL_TABLET | Freq: Every day | ORAL | 0 refills | Status: DC

## 2024-01-09 MED ORDER — CETIRIZINE HCL 10 MG PO TABS: 10.0000 mg | ORAL_TABLET | Freq: Every day | ORAL | 0 refills | Status: DC

## 2024-01-09 NOTE — Progress Notes (Signed)

## 2024-01-09 NOTE — Progress Notes (Signed)
 I have spent 5 minutes in review of e-visit questionnaire, review and updating patient chart, medical decision making and response to patient.   Claiborne Rigg, NP

## 2024-01-13 ENCOUNTER — Telehealth: Admitting: Physician Assistant

## 2024-01-13 DIAGNOSIS — U071 COVID-19: Secondary | ICD-10-CM

## 2024-01-13 MED ORDER — NIRMATRELVIR/RITONAVIR (PAXLOVID)TABLET: 3.0000 | ORAL_TABLET | Freq: Two times a day (BID) | ORAL | 0 refills | Status: AC

## 2024-01-13 MED ORDER — PSEUDOEPH-BROMPHEN-DM 30-2-10 MG/5ML PO SYRP: 5.0000 mL | ORAL_SOLUTION | Freq: Four times a day (QID) | ORAL | 0 refills | Status: DC | PRN

## 2024-01-13 MED ORDER — LIDOCAINE VISCOUS HCL 2 % MT SOLN: OROMUCOSAL | 0 refills | Status: DC

## 2024-01-13 NOTE — Patient Instructions (Signed)
 Aron Baba, thank you for joining Margaretann Loveless, PA-C for today's virtual visit.  While this provider is not your primary care provider (PCP), if your PCP is located in our provider database this encounter information will be shared with them immediately following your visit.   A [facility] MyChart account gives you access to today's visit and all your visits, tests, and labs performed at [facility] " click here if you don't have a [facility] MyChart account or go to [email or web address]  Consent: (Patient) Pamela Scott provided verbal consent for this virtual visit at the beginning of the encounter.  Current Medications:  Current Outpatient Medications:    brompheniramine-pseudoephedrine-DM 30-2-10 MG/5ML syrup, Take 5 mLs by mouth 4 (four) times daily as needed., Disp: 120 mL, Rfl: 0   lidocaine (XYLOCAINE) 2 % solution, Swallow 5-75mL every 4-6 hours as needed for sore throat, Disp: 100 mL, Rfl: 0   nirmatrelvir/ritonavir (PAXLOVID) 20 x 150 MG & 10 x 100MG  TABS, Take 3 tablets by mouth 2 (two) times daily for 5 days. (Take nirmatrelvir 150 mg two tablets twice daily for 5 days and ritonavir 100 mg one tablet twice daily for 5 days) Patient GFR is 63, Disp: 30 tablet, Rfl: 0   albuterol (PROAIR HFA) 108 (90 Base) MCG/ACT inhaler, Inhale into the lungs., Disp: , Rfl:    albuterol (VENTOLIN HFA) 108 (90 Base) MCG/ACT inhaler, Inhale 1-2 puffs into the lungs every 6 (six) hours as needed for wheezing or shortness of breath., Disp: 18 g, Rfl: 0   azelastine (ASTELIN) 0.1 % nasal spray, Place 1 spray into both nostrils 2 (two) times daily. Use in each nostril as directed, Disp: 30 mL, Rfl: 2   azithromycin (ZITHROMAX) 250 MG tablet, Take first 2 tablets together, then 1 every day until finished., Disp: 6 tablet, Rfl: 0   busPIRone (BUSPAR) 10 MG tablet, Take by mouth., Disp: , Rfl:    cetirizine (ZYRTEC ALLERGY) 10 MG tablet, Take 1 tablet (10 mg total) by  mouth 2 (two) times daily., Disp: 60 tablet, Rfl: 1   cetirizine (ZYRTEC) 10 MG tablet, Take 1 tablet (10 mg total) by mouth daily., Disp: 30 tablet, Rfl: 0   cimetidine (TAGAMET) 400 MG tablet, , Disp: , Rfl:    escitalopram (LEXAPRO) 20 MG tablet, Take by mouth., Disp: , Rfl:    gabapentin (NEURONTIN) 300 MG capsule, Take 300 mg by mouth 3 (three) times daily., Disp: , Rfl:    HYDROcodone-ibuprofen (VICOPROFEN) 7.5-200 MG tablet, Take by mouth., Disp: , Rfl:    hydrOXYzine (ATARAX/VISTARIL) 25 MG tablet, Take by mouth., Disp: , Rfl:    ipratropium (ATROVENT) 0.03 % nasal spray, Place 2 sprays into both nostrils every 12 (twelve) hours., Disp: 30 mL, Rfl: 0   meclizine (ANTIVERT) 25 MG tablet, Take 1 tablet (25 mg total) by mouth 3 (three) times daily as needed for dizziness. May cause drowsiness, Disp: 30 tablet, Rfl: 0   predniSONE (DELTASONE) 20 MG tablet, Take 2 tablets (40 mg total) by mouth daily with breakfast., Disp: 10 tablet, Rfl: 0   QULIPTA 60 MG TABS, Take 1 tablet by mouth daily., Disp: , Rfl:    tiZANidine (ZANAFLEX) 4 MG tablet, Take by mouth., Disp: , Rfl:    topiramate (TOPAMAX) 100 MG tablet, Take by mouth., Disp: , Rfl:    Medications ordered in this encounter:  Meds ordered this encounter  Medications   nirmatrelvir/ritonavir (PAXLOVID) 20 x 150 MG & 10 x 100MG  TABS  Sig: Take 3 tablets by mouth 2 (two) times daily for 5 days. (Take nirmatrelvir 150 mg two tablets twice daily for 5 days and ritonavir 100 mg one tablet twice daily for 5 days) Patient GFR is 63    Dispense:  30 tablet    Refill:  0    Supervising Provider:   Merrilee Jansky [[id number]]   brompheniramine-pseudoephedrine-DM 30-2-10 MG/5ML syrup    Sig: Take 5 mLs by mouth 4 (four) times daily as needed.    Dispense:  120 mL    Refill:  0    Supervising Provider:   Merrilee Jansky [[id number]]   lidocaine (XYLOCAINE) 2 % solution    Sig: Swallow 5-26mL every 4-6 hours as needed for sore throat     Dispense:  100 mL    Refill:  0    Supervising Provider:   Merrilee Jansky [[id number]]     *If you need refills on other medications prior to your next appointment, please contact your pharmacy*  Follow-Up: Call back or seek an in-person evaluation if the symptoms worsen or if the condition fails to improve as anticipated.  [facility] [phone]  Care Instructions: Can take to lessen severity: Vit C 500mg  twice daily Quercertin 250-500mg  twice daily Zinc 75-100mg  daily Melatonin 3-6 mg at bedtime Vit D3 1000-2000 IU daily Aspirin 81 mg daily with food Optional: Famotidine 20mg  daily Also can add tylenol/ibuprofen as needed for fevers and body aches May add Mucinex or Mucinex DM as needed for cough/congestion    Isolation Instructions: You are to isolate at home until you have been fever free for at least 24 hours without a fever-reducing medication, and symptoms have been steadily improving for 24 hours. At that time,  you can end isolation but need to mask for an additional 5 days.   If you must be around other household members who do not have symptoms, you need to make sure that both you and the family members are masking consistently with a high-quality mask.  If you note any worsening of symptoms despite treatment, please seek an in-person evaluation ASAP. If you note any significant shortness of breath or any chest pain, please seek ER evaluation. Please do not delay care!   COVID-19: What to Do if You Are Sick If you test positive and are an older adult or someone who is at high risk of getting very sick from COVID-19, treatment may be available. Contact a healthcare provider right away after a positive test to determine if you are eligible, even if your symptoms are mild right now. You can also visit a Test to Treat location and, if eligible, receive a prescription from a provider. Don't delay: Treatment must be started within the first few days to be  effective. If you have a fever, cough, or other symptoms, you might have COVID-19. Most people have mild illness and are able to recover at home. If you are sick: Keep track of your symptoms. If you have an emergency warning sign (including trouble breathing), call 911. Steps to help prevent the spread of COVID-19 if you are sick If you are sick with COVID-19 or think you might have COVID-19, follow the steps below to care for yourself and to help protect other people in your home and community. Stay home except to get medical care Stay home. Most people with COVID-19 have mild illness and can recover at home without medical care. Do not leave your home, except to  get medical care. Do not visit public areas and do not go to places where you are unable to wear a mask. Take care of yourself. Get rest and stay hydrated. Take over-the-counter medicines, such as acetaminophen, to help you feel better. Stay in touch with your doctor. Call before you get medical care. Be sure to get care if you have trouble breathing, or have any other emergency warning signs, or if you think it is an emergency. Avoid public transportation, ride-sharing, or taxis if possible. Get tested If you have symptoms of COVID-19, get tested. While waiting for test results, stay away from others, including staying apart from those living in your household. Get tested as soon as possible after your symptoms start. Treatments may be available for people with COVID-19 who are at risk for becoming very sick. Don't delay: Treatment must be started early to be effective--some treatments must begin within 5 days of your first symptoms. Contact your healthcare provider right away if your test result is positive to determine if you are eligible. Self-tests are one of several options for testing for the virus that causes COVID-19 and may be more convenient than laboratory-based tests and point-of-care tests. Ask your healthcare provider or your  local health department if you need help interpreting your test results. You can visit your state, tribal, local, and territorial health department's website to look for the latest local information on testing sites. Separate yourself from other people As much as possible, stay in a specific room and away from other people and pets in your home. If possible, you should use a separate bathroom. If you need to be around other people or animals in or outside of the home, wear a well-fitting mask. Tell your close contacts that they may have been exposed to COVID-19. An infected person can spread COVID-19 starting 48 hours (or 2 days) before the person has any symptoms or tests positive. By letting your close contacts know they may have been exposed to COVID-19, you are helping to protect everyone. See COVID-19 and Animals if you have questions about pets. If you are diagnosed with COVID-19, someone from the health department may call you. Answer the call to slow the spread. Monitor your symptoms Symptoms of COVID-19 include fever, cough, or other symptoms. Follow care instructions from your healthcare provider and local health department. Your local health authorities may give instructions on checking your symptoms and reporting information. When to seek emergency medical attention Look for emergency warning signs* for COVID-19. If someone is showing any of these signs, seek emergency medical care immediately: Trouble breathing Persistent pain or pressure in the chest New confusion Inability to wake or stay awake Pale, gray, or blue-colored skin, lips, or nail beds, depending on skin tone *This list is not all possible symptoms. Please call your medical provider for any other symptoms that are severe or concerning to you. Call 911 or call ahead to your local emergency facility: Notify the operator that you are seeking care for someone who has or may have COVID-19. Call ahead before visiting your  doctor Call ahead. Many medical visits for routine care are being postponed or done by phone or telemedicine. If you have a medical appointment that cannot be postponed, call your doctor's office, and tell them you have or may have COVID-19. This will help the office protect themselves and other patients. If you are sick, wear a well-fitting mask You should wear a mask if you must be around other people or animals,  including pets (even at home). Wear a mask with the best fit, protection, and comfort for you. You don't need to wear the mask if you are alone. If you can't put on a mask (because of trouble breathing, for example), cover your coughs and sneezes in some other way. Try to stay at least 6 feet away from other people. This will help protect the people around you. Masks should not be placed on young children under age 53 years, anyone who has trouble breathing, or anyone who is not able to remove the mask without help. Cover your coughs and sneezes Cover your mouth and nose with a tissue when you cough or sneeze. Throw away used tissues in a lined trash can. Immediately wash your hands with soap and water for at least 20 seconds. If soap and water are not available, clean your hands with an alcohol-based hand sanitizer that contains at least 60% alcohol. Clean your hands often Wash your hands often with soap and water for at least 20 seconds. This is especially important after blowing your nose, coughing, or sneezing; going to the bathroom; and before eating or preparing food. Use hand sanitizer if soap and water are not available. Use an alcohol-based hand sanitizer with at least 60% alcohol, covering all surfaces of your hands and rubbing them together until they feel dry. Soap and water are the best option, especially if hands are visibly dirty. Avoid touching your eyes, nose, and mouth with unwashed hands. Handwashing Tips Avoid sharing personal household items Do not share dishes,  drinking glasses, cups, eating utensils, towels, or bedding with other people in your home. Wash these items thoroughly after using them with soap and water or put in the dishwasher. Clean surfaces in your home regularly Clean and disinfect high-touch surfaces (for example, doorknobs, tables, handles, light switches, and countertops) in your "sick room" and bathroom. In shared spaces, you should clean and disinfect surfaces and items after each use by the person who is ill. If you are sick and cannot clean, a caregiver or other person should only clean and disinfect the area around you (such as your bedroom and bathroom) on an as needed basis. Your caregiver/other person should wait as long as possible (at least several hours) and wear a mask before entering, cleaning, and disinfecting shared spaces that you use. Clean and disinfect areas that may have blood, stool, or body fluids on them. Use household cleaners and disinfectants. Clean visible dirty surfaces with household cleaners containing soap or detergent. Then, use a household disinfectant. Use a product from [employer] List N: Disinfectants for Coronavirus (COVID-19). Be sure to follow the instructions on the label to ensure safe and effective use of the product. Many products recommend keeping the surface wet with a disinfectant for a certain period of time (look at "contact time" on the product label). You may also need to wear personal protective equipment, such as gloves, depending on the directions on the product label. Immediately after disinfecting, wash your hands with soap and water for 20 seconds. For completed guidance on cleaning and disinfecting your home, visit Complete Disinfection Guidance. Take steps to improve ventilation at home Improve ventilation (air flow) at home to help prevent from spreading COVID-19 to other people in your household. Clear out COVID-19 virus particles in the air by opening windows, using air filters, and  turning on fans in your home. Use this interactive tool to learn how to improve air flow in your home. When you can be  around others after being sick with COVID-19 Deciding when you can be around others is different for different situations. Find out when you can safely end home isolation. For any additional questions about your care, contact your healthcare provider or state or local health department. 12/25/2020 Content source: [facility] ([facility]), Division of Viral Diseases This information is not intended to replace advice given to you by your health care provider. Make sure you discuss any questions you have with your health care provider. Document Revised: 02/07/2021 Document Reviewed: 02/07/2021 Elsevier Patient Education  2022 [employer].     If you have been instructed to have an in-person evaluation today at a local Urgent Care facility, please use the link below. It will take you to a list of all of our available [facility], including address, phone number and hours of operation. Please do not delay care.  [facility]  If you or a family member do not have a primary care provider, use the link below to schedule a visit and establish care. When you choose a [facility] primary care physician or advanced practice provider, you gain a long-term partner in health. Find a Primary Care Provider  Learn more about [facility] in-office and virtual care options: [facility] - Get Care Now

## 2024-01-13 NOTE — Progress Notes (Signed)
 Virtual Visit Consent   Ronnett Pullin, you are scheduled for a virtual visit with a [facility] provider today. Just as with appointments in the office, your consent must be obtained to participate. Your consent will be active for this visit and any virtual visit you may have with one of our providers in the next 365 days. If you have a MyChart account, a copy of this consent can be sent to you electronically.  As this is a virtual visit, video technology does not allow for your provider to perform a traditional examination. This may limit your provider's ability to fully assess your condition. If your provider identifies any concerns that need to be evaluated in person or the need to arrange testing (such as labs, EKG, etc.), we will make arrangements to do so. Although advances in technology are sophisticated, we cannot ensure that it will always work on either your end or our end. If the connection with a video visit is poor, the visit may have to be switched to a telephone visit. With either a video or telephone visit, we are not always able to ensure that we have a secure connection.  By engaging in this virtual visit, you consent to the provision of healthcare and authorize for your insurance to be billed (if applicable) for the services provided during this visit. Depending on your insurance coverage, you may receive a charge related to this service.  I need to obtain your verbal consent now. Are you willing to proceed with your visit today? Maebry Obrien has provided verbal consent on 01/13/2024 for a virtual visit (video or telephone). Margaretann Loveless, PA-C  Date: 01/13/2024 5:52 PM   Virtual Visit via Video Note   I, Margaretann Loveless, connected with  Iisha Soyars  ([id number], 1965/12/19) on 01/13/24 at  5:45 PM EDT by a video-enabled telemedicine application and verified that I am speaking with the correct person using two identifiers.  Location: Patient: Virtual  Visit Location Patient: Home Provider: Virtual Visit Location Provider: Home Office   I discussed the limitations of evaluation and management by telemedicine and the availability of in person appointments. The patient expressed understanding and agreed to proceed.    History of Present Illness: Cheril Slattery is a 58 y.o. who identifies as a female who was assigned female at birth, and is being seen today for Covid 62.  HPI: URI  This is a new problem. The current episode started yesterday (Covid 19 exposure, tested positive today, symptoms started yesterday). The problem has been gradually worsening. There has been no fever. Associated symptoms include congestion, coughing, ear pain, headaches, a plugged ear sensation, rhinorrhea (and post nasal drainage), sinus pain and a sore throat. Pertinent negatives include no diarrhea, nausea, vomiting or wheezing. Associated symptoms comments: Hoarse voice, body aches. Treatments tried: on prednisone for asthma already, albuterol, ibuprofen, excedrin. The treatment provided no relief.      Problems:  Patient Active Problem List   Diagnosis Date Noted   Rectal bleeding 12/03/2015   Constipation 12/03/2015    Allergies:  Allergies  Allergen Reactions   Amoxicillin     sob   Phenergan [Promethazine Hcl]     Jittery inside   Tape Rash   Medications:  Current Outpatient Medications:    brompheniramine-pseudoephedrine-DM 30-2-10 MG/5ML syrup, Take 5 mLs by mouth 4 (four) times daily as needed., Disp: 120 mL, Rfl: 0   lidocaine (XYLOCAINE) 2 % solution, Swallow 5-72mL every 4-6 hours as needed  for sore throat, Disp: 100 mL, Rfl: 0   nirmatrelvir/ritonavir (PAXLOVID) 20 x 150 MG & 10 x 100MG  TABS, Take 3 tablets by mouth 2 (two) times daily for 5 days. (Take nirmatrelvir 150 mg two tablets twice daily for 5 days and ritonavir 100 mg one tablet twice daily for 5 days) Patient GFR is 63, Disp: 30 tablet, Rfl: 0   albuterol (PROAIR HFA) 108 (90  Base) MCG/ACT inhaler, Inhale into the lungs., Disp: , Rfl:    albuterol (VENTOLIN HFA) 108 (90 Base) MCG/ACT inhaler, Inhale 1-2 puffs into the lungs every 6 (six) hours as needed for wheezing or shortness of breath., Disp: 18 g, Rfl: 0   azelastine (ASTELIN) 0.1 % nasal spray, Place 1 spray into both nostrils 2 (two) times daily. Use in each nostril as directed, Disp: 30 mL, Rfl: 2   azithromycin (ZITHROMAX) 250 MG tablet, Take first 2 tablets together, then 1 every day until finished., Disp: 6 tablet, Rfl: 0   busPIRone (BUSPAR) 10 MG tablet, Take by mouth., Disp: , Rfl:    cetirizine (ZYRTEC ALLERGY) 10 MG tablet, Take 1 tablet (10 mg total) by mouth 2 (two) times daily., Disp: 60 tablet, Rfl: 1   cetirizine (ZYRTEC) 10 MG tablet, Take 1 tablet (10 mg total) by mouth daily., Disp: 30 tablet, Rfl: 0   cimetidine (TAGAMET) 400 MG tablet, , Disp: , Rfl:    escitalopram (LEXAPRO) 20 MG tablet, Take by mouth., Disp: , Rfl:    gabapentin (NEURONTIN) 300 MG capsule, Take 300 mg by mouth 3 (three) times daily., Disp: , Rfl:    HYDROcodone-ibuprofen (VICOPROFEN) 7.5-200 MG tablet, Take by mouth., Disp: , Rfl:    hydrOXYzine (ATARAX/VISTARIL) 25 MG tablet, Take by mouth., Disp: , Rfl:    ipratropium (ATROVENT) 0.03 % nasal spray, Place 2 sprays into both nostrils every 12 (twelve) hours., Disp: 30 mL, Rfl: 0   meclizine (ANTIVERT) 25 MG tablet, Take 1 tablet (25 mg total) by mouth 3 (three) times daily as needed for dizziness. May cause drowsiness, Disp: 30 tablet, Rfl: 0   predniSONE (DELTASONE) 20 MG tablet, Take 2 tablets (40 mg total) by mouth daily with breakfast., Disp: 10 tablet, Rfl: 0   QULIPTA 60 MG TABS, Take 1 tablet by mouth daily., Disp: , Rfl:    tiZANidine (ZANAFLEX) 4 MG tablet, Take by mouth., Disp: , Rfl:    topiramate (TOPAMAX) 100 MG tablet, Take by mouth., Disp: , Rfl:   Observations/Objective: Patient is well-developed, well-nourished in no acute distress.  Resting comfortably  at home.  Head is normocephalic, atraumatic.  No labored breathing.  Speech is clear and coherent with logical content.  Patient is alert and oriented at baseline.    Assessment and Plan: 1. COVID-19 (Primary) - nirmatrelvir/ritonavir (PAXLOVID) 20 x 150 MG & 10 x 100MG  TABS; Take 3 tablets by mouth 2 (two) times daily for 5 days. (Take nirmatrelvir 150 mg two tablets twice daily for 5 days and ritonavir 100 mg one tablet twice daily for 5 days) Patient GFR is 63  Dispense: 30 tablet; Refill: 0 - brompheniramine-pseudoephedrine-DM 30-2-10 MG/5ML syrup; Take 5 mLs by mouth 4 (four) times daily as needed.  Dispense: 120 mL; Refill: 0 - lidocaine (XYLOCAINE) 2 % solution; Swallow 5-4mL every 4-6 hours as needed for sore throat  Dispense: 100 mL; Refill: 0  - Continue OTC symptomatic management of choice - Will send OTC vitamins and supplement information through AVS - Paxlovid prescribed - Bromfed DM for cough -  Viscous lidocaine for sore throat - Push fluids - Rest as needed - Discussed return precautions and when to seek in-person evaluation, sent via AVS as well   Follow Up Instructions: I discussed the assessment and treatment plan with the patient. The patient was provided an opportunity to ask questions and all were answered. The patient agreed with the plan and demonstrated an understanding of the instructions.  A copy of instructions were sent to the patient via MyChart unless otherwise noted below.    The patient was advised to call back or seek an in-person evaluation if the symptoms worsen or if the condition fails to improve as anticipated.    Margaretann Loveless, PA-C

## 2024-01-18 ENCOUNTER — Encounter (HOSPITAL_COMMUNITY)

## 2024-01-18 ENCOUNTER — Ambulatory Visit: Admitting: Family Medicine

## 2024-01-20 DIAGNOSIS — M47816 Spondylosis without myelopathy or radiculopathy, lumbar region: Secondary | ICD-10-CM | POA: Diagnosis not present

## 2024-01-21 ENCOUNTER — Ambulatory Visit: Admitting: Student in an Organized Health Care Education/Training Program

## 2024-01-26 ENCOUNTER — Encounter (HOSPITAL_COMMUNITY): Payer: Self-pay

## 2024-01-26 ENCOUNTER — Ambulatory Visit (HOSPITAL_COMMUNITY): Attending: Nurse Practitioner

## 2024-01-26 DIAGNOSIS — M549 Dorsalgia, unspecified: Secondary | ICD-10-CM | POA: Insufficient documentation

## 2024-01-26 DIAGNOSIS — G8929 Other chronic pain: Secondary | ICD-10-CM | POA: Diagnosis not present

## 2024-01-26 DIAGNOSIS — M542 Cervicalgia: Secondary | ICD-10-CM | POA: Diagnosis not present

## 2024-01-26 DIAGNOSIS — M6281 Muscle weakness (generalized): Secondary | ICD-10-CM | POA: Insufficient documentation

## 2024-01-26 NOTE — Therapy (Signed)
 OUTPATIENT PHYSICAL THERAPY THORACOLUMBAR EVALUATION   Patient Name: Pamela Scott MRN: [id number] DOB:1965-12-05, 58 y.o., female Today's Date: 01/26/2024  END OF SESSION:  PT End of Session - 01/26/24 1512     Visit Number 2    Number of Visits 6    Date for PT Re-Evaluation 02/29/24    Authorization Type Medicaid [employer]    Authorization Time Period 5v from 01/06/24-03/05/24    Authorization - Visit Number 1    Authorization - Number of Visits 5    Progress Note Due on Visit 5    PT Start Time 1439    PT Stop Time 1512    PT Time Calculation (min) 33 min    Activity Tolerance Patient tolerated treatment well;No increased pain    Behavior During Therapy WFL for tasks assessed/performed              Past Medical History:  Diagnosis Date   Anxiety    Asthma    Depression    Fibromyalgia    Migraines    Past Surgical History:  Procedure Laterality Date   ABDOMINAL HYSTERECTOMY  1990s   endometriosis   BILATERAL OOPHORECTOMY     COLONOSCOPY     COLONOSCOPY N/A 12/24/2015   Procedure: COLONOSCOPY;  Surgeon: Alyce Jubilee, MD;  [facility];  Service: Endoscopy;  Laterality: N/A;  100 - moved to 1:30 - office to notify - pt can't come earlier per office   Patient Active Problem List   Diagnosis Date Noted   Rectal bleeding 12/03/2015   Constipation 12/03/2015    PCP: Lenda Quan, MD  REFERRING PROVIDER: Glorious Larry, FNP  REFERRING DIAG: Chronic neck and back pain  Rationale for Evaluation and Treatment: Rehabilitation  THERAPY DIAG:  Chronic neck and back pain  Muscle weakness (generalized)  ONSET DATE: > 10+ years  SUBJECTIVE:                                                                                                                                                                                           SUBJECTIVE STATEMENT: Pt reporting that she had COVID just recently and she was unable to complete HEP. Pt tried  injections from her Pain Specialist and had to agree to injections. The injections back fired, per patient, and caused more pain.  PERTINENT HISTORY:  Fibromyalgia Depression  PAIN:  Are you having pain? Yes: NPRS scale: 5/10 Pain location: Low back primarily and intermittent radiation from neck into shoulders Pain description: stabbing and radiating-pt showing radiation pattern of sacrum Aggravating factors: Lifting, standing, work Relieving factors: Rest  PRECAUTIONS: None  RED FLAGS: None   WEIGHT BEARING RESTRICTIONS: No  FALLS:  Has patient fallen in last 6 months? No   PATIENT GOALS: "I would like to not hurt as much."  OBJECTIVE:  Note: Objective measures were completed at Evaluation unless otherwise noted.  DIAGNOSTIC FINDINGS:    PATIENT SURVEYS:  Modified Oswestry 12/50 = 24%   COGNITION: Overall cognitive status: Within functional limits for tasks assessed     SENSATION: WFL  POSTURE: rounded shoulders, forward head, and decreased lumbar lordosis  PALPATION: Hypermobile CPAs from L1-L5- TTP at sacrum and PSIS  LUMBAR ROM:   AROM eval  Flexion Toes to feet- no pain  Extension 100% painful  Right lateral flexion 100%  Left lateral flexion 100%  Right rotation 100% *  Left rotation 100% *   (Blank rows = not tested)  LOWER EXTREMITY ROM:     Active  Right eval Left eval  Hip flexion    Hip extension    Hip abduction    Hip adduction    Hip internal rotation    Hip external rotation    Knee flexion    Knee extension    Ankle dorsiflexion    Ankle plantarflexion    Ankle inversion    Ankle eversion     (Blank rows = not tested)  LOWER EXTREMITY MMT:    MMT Right eval Left eval  Hip flexion 4 4  Hip extension 4-* 4-*  Hip abduction 3+* 3+*  Hip adduction    Hip internal rotation    Hip external rotation    Knee flexion    Knee extension    Ankle dorsiflexion    Ankle plantarflexion    Ankle inversion    Ankle eversion      (Blank rows = not tested)  LUMBAR SPECIAL TESTS:  Prone instability test: Positive, Straight leg raise test: Negative, and SI Compression/distraction test: Positive GAIT: Distance walked: 86ft Assistive device utilized: None Level of assistance: Complete Independence Comments: increased anterior trunk posterior with mild posterior pelvic tilt and mild trendelenberg.   TREATMENT DATE:  01/26/2024  -Supine bridge with GTB around knees x 12 with 3'' isometric hold -Sidelying clamshell with GTB x15 with 3'' isometric-cus for reduced lumbar rotation bilateralluy -Prone Alternating Arm and Leg Lifts x10 bilaterally 3'' isometric -Prone Scapular Slide with Shoulder Extension 6x10'' -Quadruped plank 5x3'' -Standing palloff press x 12 bilaterally with GTB- cues for reduced aggressing punching compensation of UB   01/04/2024 Evaluation                                                                                                                                 PATIENT EDUCATION:  Education details: PT Evaluation, findings, prognosis, frequency, attendance policy, and HEP. Person educated: Patient Education method: Medical illustrator Education comprehension: verbalized understanding  HOME EXERCISE PROGRAM: Access Code: [email or web address] D URL: [email or web address] Date: 01/26/2024 Prepared by: Irene Mannheim  Exercises - Supine Bridge with  Resistance Band  - 1 x daily - 7 x weekly - 3 sets - 12-15 reps - 5 hold - Clamshell with Resistance  - 1 x daily - 7 x weekly - 3 sets - 12-15 reps - 5 hold - Prone Alternating Arm and Leg Lifts  - 1 x daily - 7 x weekly - 3 sets - 10 reps - Prone Scapular Slide with Shoulder Extension  - 1 x daily - 7 x weekly - 3 sets - 10 reps - Primal Push Up  - 1 x daily - 7 x weekly - 3 sets - 10 reps  ASSESSMENT:  CLINICAL IMPRESSION: Pt tolerated treatment session well. HEP overview was completed and new interventions updated as well. Pt  continues moderate pain level 4/10 and no changes throughout session. Noticeable fatigue in UB and gluteal musculatures. Pt continues to show spinal instability, specifically felt at lumbar spinal rotation during attempted quadruped activities. Pt will benefit from skilled Physical Therapy services to address deficits/limitations in order to improve functional and QOL.   OBJECTIVE IMPAIRMENTS: difficulty walking, decreased strength, impaired perceived functional ability, increased muscle spasms, improper body mechanics, postural dysfunction, and pain.   ACTIVITY LIMITATIONS: carrying, lifting, bending, sitting, standing, squatting, reach over head, hygiene/grooming, locomotion level, and caring for others  PARTICIPATION LIMITATIONS: cleaning, laundry, community activity, occupation, and ADLs and transfers for pt's niece  PERSONAL FACTORS: Age are also affecting patient's functional outcome.   REHAB POTENTIAL: Excellent  CLINICAL DECISION MAKING: Stable/uncomplicated  EVALUATION COMPLEXITY: Low   GOALS: Goals reviewed with patient? No  SHORT TERM GOALS: Target date: 02/01/2024  Pt will be independent with HEP in order to demonstrate participation in Physical Therapy POC.  Baseline: Goal status: INITIAL  2.  Pt will report 3/10 pain with mobility/functional transfers in order to demonstrate improved pain with ADLs.  Baseline:  Goal status: INITIAL  LONG TERM GOALS: Target date: 02/29/2024  Pt will report 4/10 pain in 5/7 days of the week while transferring niecein order to demonstrate improved functional strength to return to desired activities.  Baseline: see objective.  Goal status: INITIAL  2.  Pt will improve BLE MMT by at least 1 grade in order to maintain proper lifting posture, reduce risk of injury and reduce pain during functional transfers for niece. .  Baseline: see objective.  Goal status: INITIAL  3.  Pt will improve Modified Oswestry score by 8 points in order to  demonstrate improved pain with functional goals and outcomes. Baseline: see objective.  Goal status: INITIAL  4.  Pt will report 0/10 pain with mobility in order to demonstrate reduced pain with ADLs lasting greater than 30 minutes.  Baseline: see objective.  Goal status: INITIAL  PLAN:  PT FREQUENCY: 1x/week  PT DURATION: 8 weeks  PLANNED INTERVENTIONS: 97164- PT Re-evaluation, 97110-Therapeutic exercises, 97530- Therapeutic activity, [id number]- Neuromuscular re-education, 97535- Self Care, 52841- Manual therapy, [id number]- Gait training, [phone]- Electrical stimulation (unattended), [phone]- Electrical stimulation (manual), Patient/Family education, DME instructions, Cryotherapy, and Moist heat.  PLAN FOR NEXT SESSION: Advance HEP, postural strengthening, practice transferring pt's niece, maintain spinal stability throughout transfer practice.   Gatha Kaska PT, DPT [facility] [phone] office    Gatha Kaska, PT 01/26/2024, 3:13 PM

## 2024-01-28 ENCOUNTER — Ambulatory Visit: Payer: Medicaid Other | Admitting: Family Medicine

## 2024-01-28 ENCOUNTER — Telehealth: Payer: Self-pay

## 2024-01-28 ENCOUNTER — Encounter: Payer: Self-pay | Admitting: Student in an Organized Health Care Education/Training Program

## 2024-01-28 ENCOUNTER — Ambulatory Visit: Admitting: Student in an Organized Health Care Education/Training Program

## 2024-01-28 VITALS — BP 110/72 | HR 90 | Ht 66.14 in | Wt 144.0 lb

## 2024-01-28 DIAGNOSIS — F325 Major depressive disorder, single episode, in full remission: Secondary | ICD-10-CM | POA: Insufficient documentation

## 2024-01-28 DIAGNOSIS — Z1159 Encounter for screening for other viral diseases: Secondary | ICD-10-CM

## 2024-01-28 DIAGNOSIS — Z131 Encounter for screening for diabetes mellitus: Secondary | ICD-10-CM

## 2024-01-28 DIAGNOSIS — E7800 Pure hypercholesterolemia, unspecified: Secondary | ICD-10-CM

## 2024-01-28 DIAGNOSIS — G8929 Other chronic pain: Secondary | ICD-10-CM | POA: Insufficient documentation

## 2024-01-28 DIAGNOSIS — G43E09 Chronic migraine with aura, not intractable, without status migrainosus: Secondary | ICD-10-CM | POA: Diagnosis not present

## 2024-01-28 DIAGNOSIS — M545 Low back pain, unspecified: Secondary | ICD-10-CM

## 2024-01-28 DIAGNOSIS — Z114 Encounter for screening for human immunodeficiency virus [HIV]: Secondary | ICD-10-CM

## 2024-01-28 DIAGNOSIS — M797 Fibromyalgia: Secondary | ICD-10-CM

## 2024-01-28 DIAGNOSIS — F419 Anxiety disorder, unspecified: Secondary | ICD-10-CM | POA: Diagnosis not present

## 2024-01-28 LAB — COMPREHENSIVE METABOLIC PANEL WITH GFR
ALT: 23 U/L (ref 0–35)
AST: 18 U/L (ref 0–37)
Albumin: 4.5 g/dL (ref 3.5–5.2)
Alkaline Phosphatase: 70 U/L (ref 39–117)
BUN: 18 mg/dL (ref 6–23)
CO2: 22 meq/L (ref 19–32)
Calcium: 9.4 mg/dL (ref 8.4–10.5)
Chloride: 104 meq/L (ref 96–112)
Creatinine, Ser: 0.95 mg/dL (ref 0.40–1.20)
GFR: 66.13 mL/min (ref >60.00)
Glucose, Bld: 104 mg/dL — ABNORMAL HIGH (ref 70–99)
Potassium: 4.4 meq/L (ref 3.5–5.1)
Sodium: 135 meq/L (ref 135–145)
Total Bilirubin: 0.4 mg/dL (ref 0.2–1.2)
Total Protein: 7.9 g/dL (ref 6.0–8.3)

## 2024-01-28 LAB — LIPID PANEL
Cholesterol: 224 mg/dL — ABNORMAL HIGH (ref 0–200)
HDL: 83.5 mg/dL (ref >39.00)
LDL Cholesterol: 130 mg/dL — ABNORMAL HIGH (ref 0–99)
NonHDL: 140.83
Total CHOL/HDL Ratio: 3
Triglycerides: 55 mg/dL (ref 0.0–149.0)
VLDL: 11 mg/dL (ref 0.0–40.0)

## 2024-01-28 LAB — HEMOGLOBIN A1C: Hgb A1c MFr Bld: 6.3 % (ref 4.6–6.5)

## 2024-01-28 MED ORDER — BUSPIRONE HCL 15 MG PO TABS: 15.0000 mg | ORAL_TABLET | Freq: Two times a day (BID) | ORAL | 2 refills | Status: DC

## 2024-01-28 MED ORDER — ESCITALOPRAM OXALATE 20 MG PO TABS: 20.0000 mg | ORAL_TABLET | Freq: Every day | ORAL | 3 refills | Status: AC

## 2024-01-28 MED ORDER — NURTEC 75 MG PO TBDP: 75.0000 mg | ORAL_TABLET | ORAL | 2 refills | Status: DC

## 2024-01-28 NOTE — Assessment & Plan Note (Signed)
 Chronic and stable.  Unfortunately did not tolerate Cymbalta well.  Fibromyalgia mostly affects her low back, shoulders, and hips.  Not currently function limiting.  We talked a bit about how home stress and caregiver stress affects her sensation of pain.  She will continue treatment with Lexapro  for depression.  I offered counseling services for CBT which she declines for now.  Previously prescribed nortriptyline as well from her pain management center which may be helpful.

## 2024-01-28 NOTE — Telephone Encounter (Signed)
 Pharmacy Patient Advocate Encounter   Received notification from CoverMyMeds that prior authorization for  Nurtec 75MG  dispersible tablets is required/requested.   Insurance verification completed.   The patient is insured through [facility] .   Per test claim: PA required; PA submitted to above mentioned insurance via CoverMyMeds Key/confirmation #/EOC [id number] Status is pending

## 2024-01-28 NOTE — Assessment & Plan Note (Addendum)
 Long history of chronic migraine syndrome that is not currently well-controlled.  She continues to have 10 migraine days per month.  These are disabling migraines.  She has worked with neurology in the past.  She has tried treatment with topiramate which has not been effective.  She has tried [employer] in the past which was not effective.  She had no adverse reactions to the CGRP inhibitor, no hypersensitivity.  She has no hypertension.  I think a CGRP inhibitor prophylactically will be the optimal treatment for her.  Because she did not respond to [facility], I am going to prescribe Ajovy.

## 2024-01-28 NOTE — Progress Notes (Addendum)
 New Patient Office Visit  Subjective    Patient ID: Pamela Scott, female    DOB: May 30, 1966  Age: 58 y.o. MRN: [id number]  CC:   Chief Complaint  Patient presents with   Establish Care    Patient states no concerns and has not had a physical in years. Patient has not had recent lab work. Patient use to see a neurologist and was referred to pain management.      HPI  Pamela Scott presents to establish care  58 year old person living with fibromyalgia and chronic low back pain as well as chronic migraines here for management of those conditions.  She lives in [place] with her husband, previously they had physicians in [place]  that were managing their chronic conditions.  However recently they became insured with [place]  Medicaid and are now required to have [place]  based physicians.  She reports it has been many years since she is seeing her primary care physician.  She has concerns today about chronic migraines.  She has had a neurologist in [place]  in the past.  Has tried multiple different medications without benefits.  She has tried [employer] in the past, currently using topiramate, but still reporting about 10 migraine days per month.  Migraines are disabling, they last for between 4 hours and 1 day.  She has some sensation of aura before they come on.  She has worked on lifestyle modifications including sleep changes, nutrition changes, without much benefit.  Denies any history of ischemic heart disease, no recent hospitalizations.  Denies any recent surgeries.  She is active at home.  She takes care of of her 4 year old niece who has Friedreich's ataxia.  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  She spends most of her.days helping these individuals and being a caregiver to her family.  She is a [place] but currently can't even find time to go to church services.   Outpatient Encounter Medications as of 01/28/2024   Medication Sig   BELBUCA 75 MCG FILM SMARTSIG:1 Strip(s) By Mouth Every 12 Hours PRN   cetirizine  (ZYRTEC  ALLERGY) 10 MG tablet Take 1 tablet (10 mg total) by mouth 2 (two) times daily.   Fremanezumab-vfrm (AJOVY) 225 MG/1.5ML SOAJ Inject 1.5 mLs into the skin every 30 (thirty) days.   nortriptyline (PAMELOR) 25 MG capsule Take 25 mg by mouth at bedtime.   tiZANidine (ZANAFLEX) 4 MG tablet Take by mouth.   [DISCONTINUED] busPIRone  (BUSPAR ) 15 MG tablet Take 15 mg by mouth.   [DISCONTINUED] escitalopram  (LEXAPRO ) 20 MG tablet Take by mouth.   [DISCONTINUED] predniSONE  (STERAPRED UNI-PAK 21 TAB) 10 MG (21) TBPK tablet Take by mouth as directed.   [DISCONTINUED] Rimegepant Sulfate (NURTEC) 75 MG TBDP Take 1 tablet (75 mg total) by mouth every other day.   [DISCONTINUED] topiramate (TOPAMAX) 200 MG tablet Take 200 mg by mouth 2 (two) times daily.   busPIRone  (BUSPAR ) 15 MG tablet Take 1 tablet (15 mg total) by mouth 2 (two) times daily.   escitalopram  (LEXAPRO ) 20 MG tablet Take 1 tablet (20 mg total) by mouth daily.   [DISCONTINUED] albuterol  (PROAIR  HFA) 108 (90 Base) MCG/ACT inhaler Inhale into the lungs.   [DISCONTINUED] albuterol  (VENTOLIN  HFA) 108 (90 Base) MCG/ACT inhaler Inhale 1-2 puffs into the lungs every 6 (six) hours as needed for wheezing or shortness of breath. (Patient not taking: Reported on 01/28/2024)   [DISCONTINUED] azelastine  (ASTELIN ) 0.1 % nasal spray Place 1 spray into both nostrils 2 (  two) times daily. Use in each nostril as directed   [DISCONTINUED] azithromycin  (ZITHROMAX ) 250 MG tablet Take first 2 tablets together, then 1 every day until finished.   [DISCONTINUED] brompheniramine-pseudoephedrine-DM 30-2-10 MG/5ML syrup Take 5 mLs by mouth 4 (four) times daily as needed. (Patient not taking: Reported on 01/28/2024)   [DISCONTINUED] busPIRone  (BUSPAR ) 10 MG tablet Take by mouth. (Patient not taking: Reported on 01/28/2024)   [DISCONTINUED] cetirizine  (ZYRTEC ) 10 MG tablet  Take 1 tablet (10 mg total) by mouth daily.   [DISCONTINUED] cimetidine (TAGAMET) 400 MG tablet    [DISCONTINUED] gabapentin (NEURONTIN) 300 MG capsule Take 300 mg by mouth 3 (three) times daily.   [DISCONTINUED] HYDROcodone-ibuprofen (VICOPROFEN) 7.5-200 MG tablet Take by mouth.   [DISCONTINUED] hydrOXYzine (ATARAX/VISTARIL) 25 MG tablet Take by mouth.   [DISCONTINUED] ipratropium (ATROVENT ) 0.03 % nasal spray Place 2 sprays into both nostrils every 12 (twelve) hours. (Patient not taking: Reported on 01/28/2024)   [DISCONTINUED] lidocaine  (XYLOCAINE ) 2 % solution Swallow 5-10mL every 4-6 hours as needed for sore throat   [DISCONTINUED] meclizine  (ANTIVERT ) 25 MG tablet Take 1 tablet (25 mg total) by mouth 3 (three) times daily as needed for dizziness. May cause drowsiness   [DISCONTINUED] predniSONE  (DELTASONE ) 20 MG tablet Take 2 tablets (40 mg total) by mouth daily with breakfast.   [DISCONTINUED] QULIPTA 60 MG TABS Take 1 tablet by mouth daily.   [DISCONTINUED] topiramate (TOPAMAX) 100 MG tablet Take by mouth. (Patient not taking: Reported on 01/28/2024)   No facility-administered encounter medications on file as of 01/28/2024.    Past Medical History:  Diagnosis Date   Anxiety    Asthma    Depression    Fibromyalgia    Migraines     Past Surgical History:  Procedure Laterality Date   ABDOMINAL HYSTERECTOMY  1990s   endometriosis   BILATERAL OOPHORECTOMY     COLONOSCOPY     COLONOSCOPY N/A 12/24/2015   Procedure: COLONOSCOPY;  Surgeon: Alyce Jubilee, MD;  [facility];  Service: Endoscopy;  Laterality: N/A;  100 - moved to 1:30 - office to notify - pt can't come earlier per office    Family History  Problem Relation Age of Onset   Colon cancer Neg Hx    Inflammatory bowel disease Neg Hx    Rheum arthritis Mother    Heart disease Paternal Grandfather    Heart disease Paternal Grandmother     Social History   Socioeconomic History   Marital status: Married     Spouse name: Not on file   Number of children: 0   Years of education: Not on file   Highest education level: Not on file  Occupational History   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  Tobacco Use   Smoking status: Former    Current packs/day: 1.00    Average packs/day: 1 pack/day for 26.2 years (26.2 ttl pk-yrs)    Types: Cigarettes    Start date: 12/02/1997   Smokeless tobacco: Not on file  Vaping Use   Vaping status: Never Used  Substance and Sexual Activity   Alcohol use: Yes    Alcohol/week: 2.0 standard drinks of alcohol    Types: 2 Glasses of wine per week    Comment: per month   Drug use: Not Currently    Types: Marijuana    Comment: used in the past   Sexual activity: Not Currently    Birth control/protection: None  Other Topics Concern   Not on file  Social History  Narrative   Not on file   Social Drivers of Health   Financial Resource Strain: Not on file  Food Insecurity: Not on file  Transportation Needs: Not on file  Physical Activity: Not on file  Stress: Not on file  Social Connections: Not on file  Intimate Partner Violence: Not on file        Objective    BP 110/72   Pulse 90   Ht 5' 6.14" (1.68 m)   Wt 144 lb (65.3 kg)   SpO2 98%   BMI 23.14 kg/m   Physical Exam  Gen: Well-appearing woman Eyes: Normal Ears: Normal bilateral tympanic membranes Neck: Normal thyroid   Heart: Lower, no murmur Lungs: Labored, clear throughout Ext: Warm, no edema, normal appearing joints Neuro: Alert, conversational, full strength upper and lower extremities, normal gait Psych: Mood and affect, not anxious or depressed appearing, very pleasant to talk to      Assessment & Plan:   Problem List Items Addressed This Visit       Unprioritized   Chronic migraine with aura   Long history of chronic migraine syndrome that is not currently well-controlled.  She continues to have 10 migraine days per month.  These are disabling migraines.  She has worked  with neurology in the past.  She has tried treatment with topiramate which has not been effective.  She has tried [employer] in the past which was not effective.  She had no adverse reactions to the CGRP inhibitor, no hypersensitivity.  She has no hypertension.  I think a CGRP inhibitor prophylactically will be the optimal treatment for her.  Because she did not respond to [facility], I am going to prescribe Ajovy.      Relevant Medications   BELBUCA 75 MCG FILM   nortriptyline (PAMELOR) 25 MG capsule   escitalopram  (LEXAPRO ) 20 MG tablet   Fremanezumab-vfrm (AJOVY) 225 MG/1.5ML SOAJ   Other Relevant Orders   Comprehensive metabolic panel with GFR (Completed)   Depression, major, in remission (HCC)   Relevant Medications   nortriptyline (PAMELOR) 25 MG capsule   busPIRone  (BUSPAR ) 15 MG tablet   escitalopram  (LEXAPRO ) 20 MG tablet   Anxiety disorder   Relevant Medications   nortriptyline (PAMELOR) 25 MG capsule   busPIRone  (BUSPAR ) 15 MG tablet   escitalopram  (LEXAPRO ) 20 MG tablet   Fibromyalgia   Chronic and stable.  Unfortunately did not tolerate Cymbalta well.  Fibromyalgia mostly affects her low back, shoulders, and hips.  Not currently function limiting.  We talked a bit about how home stress and caregiver stress affects her sensation of pain.  She will continue treatment with Lexapro  for depression.  I offered counseling services for CBT which she declines for now.  Previously prescribed nortriptyline as well from her pain management center which may be helpful.      Relevant Medications   BELBUCA 75 MCG FILM   nortriptyline (PAMELOR) 25 MG capsule   escitalopram  (LEXAPRO ) 20 MG tablet   Hypercholesterolemia   Relevant Orders   Lipid panel (Completed)   Chronic low back pain - Primary   Chronic and stable.  Comanaged with pain medicine.  Recently started buprenorphine 75 mcg sublingual and tolerating it well.  Chronic low back pain is Nox function limiting at this point.  She  cannot tolerate Cymbalta in the past.      Relevant Medications   BELBUCA 75 MCG FILM   nortriptyline (PAMELOR) 25 MG capsule   escitalopram  (LEXAPRO ) 20 MG tablet  Other Visit Diagnoses       Encounter for HCV screening test for low risk patient       Relevant Orders   Hepatitis C antibody (Completed)     Screening for HIV (human immunodeficiency virus)       Relevant Orders   HIV Antibody (routine testing w rflx) (Completed)     Screening for diabetes mellitus       Relevant Orders   Hemoglobin A1c (Completed)       Return in about 2 months (around 03/29/2024).   Ether Hercules, MD

## 2024-01-28 NOTE — Assessment & Plan Note (Signed)
 Chronic and stable.  Comanaged with pain medicine.  Recently started buprenorphine 75 mcg sublingual and tolerating it well.  Chronic low back pain is Nox function limiting at this point.  She cannot tolerate Cymbalta in the past.

## 2024-01-29 ENCOUNTER — Encounter: Payer: Self-pay | Admitting: Student in an Organized Health Care Education/Training Program

## 2024-01-29 NOTE — Telephone Encounter (Signed)
 Pharmacy Patient Advocate Encounter  Received notification from [facility] that Prior Authorization for Nurtec 75mg  has been DENIED.  Full denial letter will be uploaded to the media tab. See denial reason below.   PA #/Case ID/Reference #: [id number]

## 2024-01-29 NOTE — Telephone Encounter (Signed)
**Note De-identified  [place] Obfuscation** Please advise 

## 2024-01-29 NOTE — Telephone Encounter (Signed)
 Difficult situation. The [employer]'s reason for denying this medicine is not accurate. The patient does not have medication overuse headache. There is nothing about that condition in my clinic note. Can the Rx team suggest a next step? Perhaps an appeal?

## 2024-01-30 LAB — HEPATITIS C ANTIBODY: Hepatitis C Ab: NONREACTIVE

## 2024-01-30 LAB — HIV ANTIBODY (ROUTINE TESTING W REFLEX): HIV 1&2 Ab, 4th Generation: NONREACTIVE

## 2024-02-01 ENCOUNTER — Encounter (HOSPITAL_COMMUNITY): Payer: Self-pay

## 2024-02-01 ENCOUNTER — Ambulatory Visit (HOSPITAL_COMMUNITY)

## 2024-02-01 ENCOUNTER — Telehealth: Payer: Self-pay

## 2024-02-01 DIAGNOSIS — M6281 Muscle weakness (generalized): Secondary | ICD-10-CM

## 2024-02-01 DIAGNOSIS — M549 Dorsalgia, unspecified: Secondary | ICD-10-CM | POA: Diagnosis not present

## 2024-02-01 DIAGNOSIS — M542 Cervicalgia: Secondary | ICD-10-CM | POA: Diagnosis not present

## 2024-02-01 DIAGNOSIS — G8929 Other chronic pain: Secondary | ICD-10-CM | POA: Diagnosis not present

## 2024-02-01 NOTE — Telephone Encounter (Signed)
 PA request has been  RESUBMITTED . New Encounter has been or will be created for follow up. For additional info see Pharmacy Prior Auth telephone encounter from 02/01/24.

## 2024-02-01 NOTE — Telephone Encounter (Signed)
 Dr.Vincent is requesting a next step or possibly starting an appeal due to insurance reason for denying this patients medication is not accurate.

## 2024-02-01 NOTE — Telephone Encounter (Signed)
 Rx team has resubmitted PA for this patient

## 2024-02-01 NOTE — Telephone Encounter (Signed)
 Pharmacy Patient Advocate Encounter   Received notification from Pt Calls Messages that prior authorization for Nurtec 75MG  dispersible tablets is required/requested.   Insurance verification completed.   The patient is insured through [facility] .   Per test claim: PA required; PA submitted to above mentioned insurance via CoverMyMeds Key/confirmation #/EOC [id number] Status is pending

## 2024-02-01 NOTE — Therapy (Signed)
 OUTPATIENT PHYSICAL THERAPY THORACOLUMBAR EVALUATION   Patient Name: Pamela Scott MRN: [id number] DOB:1966-03-08, 58 y.o., female Today's Date: 02/01/2024  END OF SESSION:  PT End of Session - 02/01/24 1431     Visit Number 3    Number of Visits 6    Date for PT Re-Evaluation 02/29/24    Authorization Type Medicaid [employer]    Authorization Time Period 5v from 01/06/24-03/05/24    Authorization - Visit Number 2    Authorization - Number of Visits 5    Progress Note Due on Visit 5    PT Start Time 1351    PT Stop Time 1426    PT Time Calculation (min) 35 min    Activity Tolerance Patient tolerated treatment well;No increased pain    Behavior During Therapy WFL for tasks assessed/performed               Past Medical History:  Diagnosis Date   Anxiety    Asthma    Depression    Fibromyalgia    Migraines    Past Surgical History:  Procedure Laterality Date   ABDOMINAL HYSTERECTOMY  1990s   endometriosis   BILATERAL OOPHORECTOMY     COLONOSCOPY     COLONOSCOPY N/A 12/24/2015   Procedure: COLONOSCOPY;  Surgeon: Alyce Jubilee, MD;  Location: [facility];  Service: Endoscopy;  Laterality: N/A;  100 - moved to 1:30 - office to notify - pt can't come earlier per office   Patient Active Problem List   Diagnosis Date Noted   Depression, major, in remission (HCC) 01/28/2024   Chronic low back pain 01/28/2024   Mild intermittent asthma, uncomplicated 07/19/2015   Hypercholesterolemia 07/14/2012   Anxiety disorder 12/28/2007   Fibromyalgia 10/21/2007   Chronic migraine with aura 07/01/1999    PCP: Lenda Quan, MD  REFERRING PROVIDER: Glorious Larry, FNP  REFERRING DIAG: Chronic neck and back pain  Rationale for Evaluation and Treatment: Rehabilitation  THERAPY DIAG:   Chronic neck and back pain  Muscle weakness (generalized)  ONSET DATE: > 10+ years  SUBJECTIVE:                                                                                                                                                                                            SUBJECTIVE STATEMENT: Pt reporting that her pain is not bad today but was terrible yesterday. Pt did not do her HEP yesterday due to the pain level.  PERTINENT HISTORY:  Fibromyalgia Depression  PAIN:  Are you having pain? Yes: NPRS scale: 5/10 Pain location: Low back primarily and intermittent radiation from neck into shoulders  Pain description: stabbing and radiating-pt showing radiation pattern of sacrum Aggravating factors: Lifting, standing, work Relieving factors: Rest  PRECAUTIONS: None  RED FLAGS: None   WEIGHT BEARING RESTRICTIONS: No  FALLS:  Has patient fallen in last 6 months? No   PATIENT GOALS: "I would like to not hurt as much."  OBJECTIVE:  Note: Objective measures were completed at Evaluation unless otherwise noted.  DIAGNOSTIC FINDINGS:    PATIENT SURVEYS:  Modified Oswestry 12/50 = 24%   COGNITION: Overall cognitive status: Within functional limits for tasks assessed     SENSATION: WFL  POSTURE: rounded shoulders, forward head, and decreased lumbar lordosis  PALPATION: Hypermobile CPAs from L1-L5- TTP at sacrum and PSIS  LUMBAR ROM:   AROM eval  Flexion Toes to feet- no pain  Extension 100% painful  Right lateral flexion 100%  Left lateral flexion 100%  Right rotation 100% *  Left rotation 100% *   (Blank rows = not tested)  LOWER EXTREMITY ROM:     Active  Right eval Left eval  Hip flexion    Hip extension    Hip abduction    Hip adduction    Hip internal rotation    Hip external rotation    Knee flexion    Knee extension    Ankle dorsiflexion    Ankle plantarflexion    Ankle inversion    Ankle eversion     (Blank rows = not tested)  LOWER EXTREMITY MMT:    MMT Right eval Left eval  Hip flexion 4 4  Hip extension 4-* 4-*  Hip abduction 3+* 3+*  Hip adduction    Hip internal rotation    Hip external rotation     Knee flexion    Knee extension    Ankle dorsiflexion    Ankle plantarflexion    Ankle inversion    Ankle eversion     (Blank rows = not tested)  LUMBAR SPECIAL TESTS:  Prone instability test: Positive, Straight leg raise test: Negative, and SI Compression/distraction test: Positive GAIT: Distance walked: 5ft Assistive device utilized: None Level of assistance: Complete Independence Comments: increased anterior trunk posterior with mild posterior pelvic tilt and mild trendelenberg.   TREATMENT DATE:  02/01/2024  -Standing Palloff press with GTB 3 x 12 bilaterally -Shoulder extension with marching 3 x 15 GTB -Supine marching 3 x 30'' with GTB at lumbar spine  for tactile cues to maintain neutral spine. -Education and demonstration regarding chair to chair transfers with practice regarding proper leading stepping pattern and proper lifting mechanics x 15'    01/26/2024  -Supine bridge with GTB around knees x 12 with 3'' isometric hold -Sidelying clamshell with GTB x15 with 3'' isometric-cus for reduced lumbar rotation bilateralluy -Prone Alternating Arm and Leg Lifts x10 bilaterally 3'' isometric -Prone Scapular Slide with Shoulder Extension 6x10'' -Quadruped plank 5x3'' -Standing palloff press x 12 bilaterally with GTB- cues for reduced aggressing punching compensation of UB   01/04/2024 Evaluation  PATIENT EDUCATION:  Education details: PT Evaluation, findings, prognosis, frequency, attendance policy, and HEP. Person educated: Patient Education method: Medical illustrator Education comprehension: verbalized understanding  HOME EXERCISE PROGRAM: Access Code: [email or web address] D URL: [email or web address] Date: 01/26/2024 Prepared by: Irene Mannheim  Exercises - Supine Bridge with Resistance Band  - 1 x daily - 7 x weekly - 3 sets -  12-15 reps - 5 hold - Clamshell with Resistance  - 1 x daily - 7 x weekly - 3 sets - 12-15 reps - 5 hold - Prone Alternating Arm and Leg Lifts  - 1 x daily - 7 x weekly - 3 sets - 10 reps - Prone Scapular Slide with Shoulder Extension  - 1 x daily - 7 x weekly - 3 sets - 10 reps - Primal Push Up  - 1 x daily - 7 x weekly - 3 sets - 10 reps  ASSESSMENT:  CLINICAL IMPRESSION: Pt tolerated therapy session well. Focused on active neuromuscular re-education core musculature with transitioning into lifting practice mimicking chair<>chair transfer. Throughout NM activities, pt noted with continual lumbar instability and with excessive pelvic rotation. Time spent on educating pt on transferring pt's caregiver. Pt will benefit from skilled Physical Therapy services to address deficits/limitations in order to improve functional and QOL.   OBJECTIVE IMPAIRMENTS: difficulty walking, decreased strength, impaired perceived functional ability, increased muscle spasms, improper body mechanics, postural dysfunction, and pain.   ACTIVITY LIMITATIONS: carrying, lifting, bending, sitting, standing, squatting, reach over head, hygiene/grooming, locomotion level, and caring for others  PARTICIPATION LIMITATIONS: cleaning, laundry, community activity, occupation, and ADLs and transfers for pt's niece  PERSONAL FACTORS: Age are also affecting patient's functional outcome.   REHAB POTENTIAL: Excellent  CLINICAL DECISION MAKING: Stable/uncomplicated  EVALUATION COMPLEXITY: Low   GOALS: Goals reviewed with patient? No  SHORT TERM GOALS: Target date: 02/01/2024  Pt will be independent with HEP in order to demonstrate participation in Physical Therapy POC.  Baseline: Goal status: INITIAL  2.  Pt will report 3/10 pain with mobility/functional transfers in order to demonstrate improved pain with ADLs.  Baseline:  Goal status: INITIAL  LONG TERM GOALS: Target date: 02/29/2024  Pt will report 4/10 pain in 5/7  days of the week while transferring niecein order to demonstrate improved functional strength to return to desired activities.  Baseline: see objective.  Goal status: INITIAL  2.  Pt will improve BLE MMT by at least 1 grade in order to maintain proper lifting posture, reduce risk of injury and reduce pain during functional transfers for niece. .  Baseline: see objective.  Goal status: INITIAL  3.  Pt will improve Modified Oswestry score by 8 points in order to demonstrate improved pain with functional goals and outcomes. Baseline: see objective.  Goal status: INITIAL  4.  Pt will report 0/10 pain with mobility in order to demonstrate reduced pain with ADLs lasting greater than 30 minutes.  Baseline: see objective.  Goal status: INITIAL  PLAN:  PT FREQUENCY: 1x/week  PT DURATION: 8 weeks  PLANNED INTERVENTIONS: 97164- PT Re-evaluation, 97110-Therapeutic exercises, 97530- Therapeutic activity, [id number]- Neuromuscular re-education, 97535- Self Care, 16109- Manual therapy, [id number]- Gait training, [phone]- Electrical stimulation (unattended), [phone]- Electrical stimulation (manual), Patient/Family education, DME instructions, Cryotherapy, and Moist heat.  PLAN FOR NEXT SESSION: Advance HEP, postural strengthening, practice transferring pt's niece, maintain spinal stability throughout transfer practice.   Gatha Kaska PT, DPT [facility] [phone] office    Gatha Kaska, PT 02/01/2024, 2:32 PM

## 2024-02-02 ENCOUNTER — Other Ambulatory Visit (HOSPITAL_COMMUNITY): Payer: Self-pay

## 2024-02-02 ENCOUNTER — Telehealth: Payer: Self-pay

## 2024-02-02 MED ORDER — AJOVY 225 MG/1.5ML ~~LOC~~ SOAJ: 1.5000 mL | SUBCUTANEOUS | 2 refills | Status: DC

## 2024-02-02 NOTE — Telephone Encounter (Signed)
 Pharmacy Patient Advocate Encounter   Received notification from Pt Calls Messages that prior authorization for AJOVY  is required/requested.   Insurance verification completed.   The patient is insured through [facility] .   Per test claim: PA required; PA submitted to above mentioned insurance via CoverMyMeds Key/confirmation #/EOC [id number] Status is pending

## 2024-02-02 NOTE — Telephone Encounter (Signed)
 Would you like to send alternative or just inform patient this is not covered at this time?

## 2024-02-02 NOTE — Telephone Encounter (Signed)
 PA request has been Submitted. New Encounter has been or will be created for follow up. For additional info see Pharmacy Prior Auth telephone encounter from 02/02/24.

## 2024-02-02 NOTE — Telephone Encounter (Signed)
 Pharmacy Patient Advocate Encounter  Received notification from [facility] that Prior Authorization for Nurtec 75MG  dispersible tablets  has been DENIED.  Full denial letter will be uploaded to the media tab. See denial reason below.   PA #/Case ID/Reference #: [id number]   DENIAL REASON: Patient must try and fail 2 preferred injectable CGRP drugs (Aimovig, Ajovy, Emgality)

## 2024-02-02 NOTE — Telephone Encounter (Signed)
 Thanks for the notification. I spoke with the patient. She has not tried Ajovy before. I have prescribed Ajovy for her. This is [place] dosing once a month. Can we please start the prior authorization for this? I sent it to the [facility]. Once it is approved, can we offer her a nurse visit to refresh her on appropriate subcutaneous dosing please?

## 2024-02-02 NOTE — Addendum Note (Signed)
 Addended by: Juel Nutley T on: 02/02/2024 12:50 PM   Modules accepted: Orders

## 2024-02-03 ENCOUNTER — Other Ambulatory Visit (HOSPITAL_COMMUNITY): Payer: Self-pay

## 2024-02-03 NOTE — Telephone Encounter (Signed)
 Pharmacy Patient Advocate Encounter  Received notification from [facility] that Prior Authorization for AJOVY has been APPROVED from 02/02/24 to 05/03/24. Ran test claim, Copay is $4. This test claim was processed through [facility]- copay amounts may vary at other pharmacies due to pharmacy/plan contracts, or as the patient moves through the different stages of their insurance plan.   PA #/Case ID/Reference #: [id number]

## 2024-02-03 NOTE — Telephone Encounter (Signed)
 Called patient to relay the approval of medication, Left VM to return call

## 2024-02-05 ENCOUNTER — Encounter: Payer: Self-pay | Admitting: Student in an Organized Health Care Education/Training Program

## 2024-02-05 NOTE — Telephone Encounter (Signed)
 Sent a my chart message about this to the patient

## 2024-02-08 ENCOUNTER — Encounter (HOSPITAL_COMMUNITY)

## 2024-02-08 ENCOUNTER — Encounter: Payer: Self-pay | Admitting: Student in an Organized Health Care Education/Training Program

## 2024-02-08 NOTE — Telephone Encounter (Signed)
 Hi Tammy,  I recently prescribed Ajovy  as a new medication for this patient.  She has picked it up but has some questions about the delivery.  Are you able to give her a call and walk her through the first injection?  Thank you.   Vallarie Gauze

## 2024-02-08 NOTE — Telephone Encounter (Signed)
 Questions regarding medication, please advise

## 2024-02-09 ENCOUNTER — Encounter: Payer: Self-pay | Admitting: Pharmacist

## 2024-02-09 DIAGNOSIS — M545 Low back pain, unspecified: Secondary | ICD-10-CM | POA: Diagnosis not present

## 2024-02-09 DIAGNOSIS — Z79891 Long term (current) use of opiate analgesic: Secondary | ICD-10-CM | POA: Diagnosis not present

## 2024-02-09 DIAGNOSIS — M47816 Spondylosis without myelopathy or radiculopathy, lumbar region: Secondary | ICD-10-CM | POA: Diagnosis not present

## 2024-02-09 DIAGNOSIS — M2550 Pain in unspecified joint: Secondary | ICD-10-CM | POA: Diagnosis not present

## 2024-02-09 DIAGNOSIS — G8929 Other chronic pain: Secondary | ICD-10-CM | POA: Diagnosis not present

## 2024-02-09 NOTE — Progress Notes (Signed)
   02/09/2024 Name: Pamela Scott MRN: [id number] DOB: 04-08-1966  Chief Complaint  Patient presents with   Medication Management    Ajovy     Pamela Scott is a 58 y.o. year old female who presented for a telephone visit.   They were referred to the pharmacist by their PCP for  Ajovy  injection education  . Mrs. Avetisyan also had questions about the correct dose of Ajovy .    Subjective:     Objective:  Lab Results  Component Value Date   HGBA1C 6.3 01/28/2024    Lab Results  Component Value Date   CREATININE 0.95 01/28/2024   BUN 18 01/28/2024   NA 135 01/28/2024   K 4.4 01/28/2024   CL 104 01/28/2024   CO2 22 01/28/2024    Lab Results  Component Value Date   CHOL 224 (H) 01/28/2024   HDL 83.50 01/28/2024   LDLCALC 130 (H) 01/28/2024   TRIG 55.0 01/28/2024   CHOLHDL 3 01/28/2024    Medications Reviewed Today     Reviewed by Cecilie Coffee, RPH-CPP (Pharmacist) on 02/09/24 at [phone]  Med List Status: <None>   Medication Order Taking? Sig Documenting Provider Last Dose Status Informant  BELBUCA 75 MCG FILM [id number] No SMARTSIG:1 Strip(s) By Mouth Every 12 Hours PRN [provider] Taking Active   busPIRone  (BUSPAR ) 15 MG tablet [id number]  Take 1 tablet (15 mg total) by mouth 2 (two) times daily. Ether Hercules, MD  Active   cetirizine  (ZYRTEC  ALLERGY) 10 MG tablet [id number] No Take 1 tablet (10 mg total) by mouth 2 (two) times daily. Corbin Dess, [place] Taking Active   escitalopram  (LEXAPRO ) 20 MG tablet [id number]  Take 1 tablet (20 mg total) by mouth daily. Ether Hercules, MD  Active   Fremanezumab -vfrm (AJOVY ) 225 MG/1.5ML SOAJ [id number]  Inject 1.5 mLs into the skin every 30 (thirty) days. Ether Hercules, MD  Active   nortriptyline (PAMELOR) 25 MG capsule [id number] No Take 25 mg by mouth at bedtime. [provider] Taking Active   tiZANidine (ZANAFLEX) 4 MG tablet [id number] No Take by mouth. [provider] Taking Active            Med Note ([place], CANDY M   Mon Dec 03, 2015 11:35 AM) Received from: [facility]              Assessment/Plan:   Injection Education:  - Discussed dose of Ajovy  - patient will administer once syringe or 225mg  once a month (there is alternative dose of injecting 3 syringes or 675mg  every 3 months).  - Discussed potential side effect - mostly injection site redness / irritation. Recommended she give 1st dose when someone is at home with her just in case of rare allergic reaction. Other rare side effects - increase in blood pressure and Reynauld's type symptoms - cold fingers or toes / numbness. She will contact office if she experiences any suspected side effects.  - Discussed how to inject Ajovy :   Site selection - upper thigh, abdomen or back of arm.   Injection technique - use firm pressure, press injection button / will hear a click, wait 20 to 30 seconds, will hear 2nd click, then count to 10 and remove injection device. Might need to apply pressure to site if any bleeding noted.     Cecilie Coffee, PharmD Clinical Pharmacist [facility]  Population Health [phone]

## 2024-02-15 ENCOUNTER — Other Ambulatory Visit (HOSPITAL_COMMUNITY): Payer: Self-pay

## 2024-02-15 ENCOUNTER — Telehealth: Payer: Self-pay

## 2024-02-15 NOTE — Telephone Encounter (Signed)
 Pharmacy Patient Advocate Encounter   Received notification from Patient Pharmacy that prior authorization for Nurtec is required/requested.   Insurance verification completed.   The patient is insured through [facility] .   Per test claim: PA required; PA submitted to above mentioned insurance via CoverMyMeds Key/confirmation #/EOC [id number] Status is pending

## 2024-02-16 ENCOUNTER — Encounter (HOSPITAL_COMMUNITY): Admitting: Physical Therapy

## 2024-02-16 NOTE — Telephone Encounter (Signed)
 Pharmacy Patient Advocate Encounter  Received notification from [facility] that Prior Authorization for Nurtec 75 has been DENIED.  Full denial letter will be uploaded to the media tab. See denial reason below.   PA #/Case ID/Reference #: [id number]

## 2024-02-23 ENCOUNTER — Other Ambulatory Visit (HOSPITAL_COMMUNITY): Payer: Self-pay | Admitting: Nurse Practitioner

## 2024-02-23 DIAGNOSIS — M47816 Spondylosis without myelopathy or radiculopathy, lumbar region: Secondary | ICD-10-CM

## 2024-02-24 ENCOUNTER — Encounter (HOSPITAL_COMMUNITY): Admitting: Physical Therapy

## 2024-02-25 ENCOUNTER — Telehealth: Payer: Self-pay | Admitting: Student in an Organized Health Care Education/Training Program

## 2024-02-25 ENCOUNTER — Encounter: Payer: Self-pay | Admitting: Student in an Organized Health Care Education/Training Program

## 2024-02-25 DIAGNOSIS — F325 Major depressive disorder, single episode, in full remission: Secondary | ICD-10-CM

## 2024-02-25 NOTE — Telephone Encounter (Signed)
 Paper work was sent from [employer] and needs signatures and sent back to its designated place. Paper work is placed in Chartered loss adjuster.   Please advise,  Thanks

## 2024-02-26 ENCOUNTER — Ambulatory Visit (HOSPITAL_COMMUNITY)
Admission: RE | Admit: 2024-02-26 | Discharge: 2024-02-26 | Disposition: A | Discharge status: Home / Self Care | Source: Ambulatory Visit | Attending: Nurse Practitioner | Admitting: Nurse Practitioner

## 2024-02-26 DIAGNOSIS — M47816 Spondylosis without myelopathy or radiculopathy, lumbar region: Secondary | ICD-10-CM | POA: Insufficient documentation

## 2024-02-26 MED ORDER — TIZANIDINE HCL 4 MG PO TABS: 4.0000 mg | ORAL_TABLET | Freq: Two times a day (BID) | ORAL | 1 refills | Status: DC | PRN

## 2024-02-26 MED ORDER — ALBUTEROL SULFATE HFA 108 (90 BASE) MCG/ACT IN AERS: 2.0000 | INHALATION_SPRAY | Freq: Four times a day (QID) | RESPIRATORY_TRACT | 2 refills | Status: AC | PRN

## 2024-02-26 NOTE — Telephone Encounter (Signed)
 Patient replying back to your mychart message from 02/25/2024 with medications.

## 2024-02-26 NOTE — Telephone Encounter (Signed)
 Placed this on your desk for signature.

## 2024-03-01 ENCOUNTER — Encounter (HOSPITAL_COMMUNITY): Admitting: Physical Therapy

## 2024-03-03 ENCOUNTER — Encounter: Payer: Self-pay | Admitting: Student in an Organized Health Care Education/Training Program

## 2024-03-08 ENCOUNTER — Encounter (HOSPITAL_COMMUNITY)

## 2024-03-08 DIAGNOSIS — M545 Low back pain, unspecified: Secondary | ICD-10-CM | POA: Diagnosis not present

## 2024-03-08 DIAGNOSIS — M2550 Pain in unspecified joint: Secondary | ICD-10-CM | POA: Diagnosis not present

## 2024-03-08 DIAGNOSIS — M47816 Spondylosis without myelopathy or radiculopathy, lumbar region: Secondary | ICD-10-CM | POA: Diagnosis not present

## 2024-03-08 DIAGNOSIS — G8929 Other chronic pain: Secondary | ICD-10-CM | POA: Diagnosis not present

## 2024-03-08 DIAGNOSIS — Z79891 Long term (current) use of opiate analgesic: Secondary | ICD-10-CM | POA: Diagnosis not present

## 2024-03-14 ENCOUNTER — Encounter: Payer: Self-pay | Admitting: Student in an Organized Health Care Education/Training Program

## 2024-03-14 DIAGNOSIS — G43E09 Chronic migraine with aura, not intractable, without status migrainosus: Secondary | ICD-10-CM

## 2024-03-14 NOTE — Telephone Encounter (Signed)
 Patient is wondering if Topriamate 200mg  can be filled for 30 days once a day until finding solutions for patients migraines?

## 2024-03-15 ENCOUNTER — Ambulatory Visit
Admission: EM | Admit: 2024-03-15 | Discharge: 2024-03-15 | Disposition: A | Discharge status: Home / Self Care | Attending: Nurse Practitioner | Admitting: Nurse Practitioner

## 2024-03-15 ENCOUNTER — Encounter: Payer: Self-pay | Admitting: Emergency Medicine

## 2024-03-15 ENCOUNTER — Ambulatory Visit (INDEPENDENT_AMBULATORY_CARE_PROVIDER_SITE_OTHER)

## 2024-03-15 ENCOUNTER — Other Ambulatory Visit: Payer: Self-pay

## 2024-03-15 DIAGNOSIS — J4521 Mild intermittent asthma with (acute) exacerbation: Secondary | ICD-10-CM

## 2024-03-15 MED ORDER — TOPIRAMATE 100 MG PO TABS: 100.0000 mg | ORAL_TABLET | Freq: Two times a day (BID) | ORAL | 1 refills | Status: DC

## 2024-03-15 MED ORDER — METHYLPREDNISOLONE ACETATE 40 MG/ML IJ SUSP
40.0000 mg | Freq: Once | INTRAMUSCULAR | Status: AC
  Administered 2024-03-15: 40 mg via INTRAMUSCULAR

## 2024-03-15 MED ORDER — PREDNISONE 20 MG PO TABS: 40.0000 mg | ORAL_TABLET | Freq: Every day | ORAL | 0 refills | Status: DC

## 2024-03-15 MED ORDER — IPRATROPIUM-ALBUTEROL 0.5-2.5 (3) MG/3ML IN SOLN
3.0000 mL | Freq: Once | RESPIRATORY_TRACT | Status: AC
  Administered 2024-03-15: 3 mL via RESPIRATORY_TRACT

## 2024-03-15 NOTE — Discharge Instructions (Addendum)
 Chest xray is negative for pneumonia.    We gave you a breathing treatment and injection of steroid medication to help with your breathing.  Continue the Flonase and also recommend starting an antihistamine daily like Zyrtec  or Claritin.  Start taking the oral prednisone  tomorrow.    If symptoms do not significantly improve with treatment or if they recur after you stop the prednisone , follow up with PCP to discuss further.  If symptoms worsen despite treatment, seek care emergently.

## 2024-03-15 NOTE — ED Triage Notes (Signed)
 Pt reports "having trouble with my asthma" for last several weeks. Pt reports has been using inhaler and otc medication with no change in "congestion".

## 2024-03-15 NOTE — Addendum Note (Signed)
 Addended by: Juel Nutley T on: 03/15/2024 07:51 AM   Modules accepted: Orders

## 2024-03-15 NOTE — ED Provider Notes (Signed)
 [facility]    CSN: [id number] Arrival date & time: 03/15/24  1635      History   Chief Complaint Chief Complaint  Patient presents with   Cough    HPI Pamela Scott is a 58 y.o. female.   Patient presents today with 3-week history of difficulty taking a deep breath.  She reports symptoms are worse only with activity, denies any fever, significant cough, or congestion.  No chest pain or shortness of breath at rest.  Reports symptoms only worsen with activity and she does have some chest tightness with activity.  No sore throat, headache, ear pain, abdominal pain, change in appetite, change in energy levels.  No palpitations.  She has been using asthma rescue inhaler more frequently than normal which seems to only help for 30 minutes or so.  Think symptoms are worse since the weather changed.  Takes Flonase daily for allergies.  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     Past Medical History:  Diagnosis Date   Anxiety    Asthma    Depression    Fibromyalgia    Migraines     Patient Active Problem List   Diagnosis Date Noted   Depression, major, in remission (HCC) 01/28/2024   Chronic low back pain 01/28/2024   Mild intermittent asthma, uncomplicated 07/19/2015   Hypercholesterolemia 07/14/2012   Anxiety disorder 12/28/2007   Fibromyalgia 10/21/2007   Chronic migraine with aura 07/01/1999    Past Surgical History:  Procedure Laterality Date   ABDOMINAL HYSTERECTOMY  1990s   endometriosis   BILATERAL OOPHORECTOMY     COLONOSCOPY     COLONOSCOPY N/A 12/24/2015   Procedure: COLONOSCOPY;  Surgeon: Alyce Jubilee, MD;  Location: [facility];  Service: Endoscopy;  Laterality: N/A;  100 - moved to 1:30 - office to notify - pt can't come earlier per office    OB History   No obstetric history on file.      Home Medications    Prior to Admission medications   Medication Sig Start Date End Date Taking? Authorizing Provider  predniSONE   (DELTASONE ) 20 MG tablet Take 2 tablets (40 mg total) by mouth daily with breakfast for 5 days. 03/15/24 03/20/24 Yes Wilhemena Harbour, NP  albuterol  (VENTOLIN  HFA) 108 (90 Base) MCG/ACT inhaler Inhale 2 puffs into the lungs every 6 (six) hours as needed for wheezing or shortness of breath. 02/26/24   Ether Hercules, MD  BELBUCA 75 MCG FILM SMARTSIG:1 Strip(s) By Mouth Every 12 Hours PRN 01/04/24   [provider]  busPIRone  (BUSPAR ) 15 MG tablet Take 1 tablet (15 mg total) by mouth 2 (two) times daily. 01/28/24   Ether Hercules, MD  cetirizine  (ZYRTEC  ALLERGY) 10 MG tablet Take 1 tablet (10 mg total) by mouth 2 (two) times daily. 03/08/22   Corbin Dess, PA-C  escitalopram  (LEXAPRO ) 20 MG tablet Take 1 tablet (20 mg total) by mouth daily. 01/28/24   Ether Hercules, MD  Fremanezumab -vfrm (AJOVY ) 225 MG/1.5ML SOAJ Inject 1.5 mLs into the skin every 30 (thirty) days. 02/02/24   Ether Hercules, MD  nortriptyline (PAMELOR) 25 MG capsule Take 25 mg by mouth at bedtime.    [provider]  tiZANidine  (ZANAFLEX ) 4 MG tablet Take 1 tablet (4 mg total) by mouth 2 (two) times daily as needed for muscle spasms. 02/26/24   Ether Hercules, MD  topiramate (TOPAMAX) 100 MG tablet Take 1 tablet (100 mg total) by  mouth 2 (two) times daily. 03/15/24   Ether Hercules, MD    Family History Family History  Problem Relation Age of Onset   Colon cancer Neg Hx    Inflammatory bowel disease Neg Hx    Rheum arthritis Mother    Heart disease Paternal Grandfather    Heart disease Paternal Grandmother     Social History Social History   Tobacco Use   Smoking status: Former    Current packs/day: 1.00    Average packs/day: 1 pack/day for 26.3 years (26.3 ttl pk-yrs)    Types: Cigarettes    Start date: 12/02/1997  Vaping Use   Vaping status: Never Used  Substance Use Topics   Alcohol use: Yes    Alcohol/week: 2.0 standard drinks of alcohol     Types: 2 Glasses of wine per week    Comment: per month   Drug use: Not Currently    Types: Marijuana    Comment: used in the past     Allergies   Amoxicillin, Phenergan  [promethazine  hcl], and Tape   Review of Systems Review of Systems Per HPI  Physical Exam Triage Vital Signs ED Triage Vitals  Encounter Vitals Group     BP 03/15/24 1641 113/69     Systolic BP Percentile --      Diastolic BP Percentile --      Pulse Rate 03/15/24 1641 83     Resp 03/15/24 1641 20     Temp 03/15/24 1641 97.6 F (36.4 C)     Temp Source 03/15/24 1641 Temporal     SpO2 03/15/24 1641 94 %     Weight --      Height --      Head Circumference --      Peak Flow --      Pain Score 03/15/24 1644 0     Pain Loc --      Pain Education --      Exclude from Growth Chart --    No data found.  Updated Vital Signs BP 113/69 (BP Location: Right Arm)   Pulse 83   Temp 97.6 F (36.4 C) (Temporal)   Resp 20   SpO2 94%   SpO2 post DuoNeb: 99% room air  Visual Acuity Right Eye Distance:   Left Eye Distance:   Bilateral Distance:    Right Eye Near:   Left Eye Near:    Bilateral Near:     Physical Exam Vitals and nursing note reviewed.  Constitutional:      General: She is not in acute distress.    Appearance: Normal appearance. She is not ill-appearing or toxic-appearing.  HENT:     Head: Normocephalic and atraumatic.     Right Ear: Tympanic membrane, ear canal and external ear normal.     Left Ear: Tympanic membrane, ear canal and external ear normal.     Nose: No congestion or rhinorrhea.     Mouth/Throat:     Mouth: Mucous membranes are moist.     Pharynx: Oropharynx is clear. No oropharyngeal exudate or posterior oropharyngeal erythema.  Eyes:     General: No scleral icterus.    Extraocular Movements: Extraocular movements intact.  Cardiovascular:     Rate and Rhythm: Normal rate and regular rhythm.  Pulmonary:     Effort: Pulmonary effort is normal. No respiratory  distress.     Breath sounds: Normal breath sounds. Decreased air movement present. No wheezing, rhonchi or rales.  Musculoskeletal:  Cervical back: Normal range of motion and neck supple.  Lymphadenopathy:     Cervical: No cervical adenopathy.  Skin:    General: Skin is warm and dry.     Coloration: Skin is not jaundiced or pale.     Findings: No erythema or rash.  Neurological:     Mental Status: She is alert and oriented to person, place, and time.  Psychiatric:        Behavior: Behavior is cooperative.      UC Treatments / Results  Labs (all labs ordered are listed, but only abnormal results are displayed) Labs Reviewed - No data to display  EKG   Radiology DG Chest 2 View Result Date: 03/15/2024 CLINICAL DATA:  shortness of breath x weeks EXAM: CHEST - 2 VIEW COMPARISON:  None available. FINDINGS: No focal airspace consolidation, pleural effusion, or pneumothorax. No cardiomegaly. No acute fracture or destructive lesion. IMPRESSION: No acute cardiopulmonary abnormality. Electronically Signed   By: Rance Burrows M.D.   On: 03/15/2024 17:22    Procedures Procedures (including critical care time)  Medications Ordered in UC Medications  ipratropium-albuterol  (DUONEB) 0.5-2.5 (3) MG/3ML nebulizer solution 3 mL (3 mLs Nebulization Given 03/15/24 1720)  methylPREDNISolone  acetate (DEPO-MEDROL ) injection 40 mg (40 mg Intramuscular Given 03/15/24 1720)    Initial Impression / Assessment and Plan / UC Course  I have reviewed the triage vital signs and the nursing notes.  Pertinent labs & imaging results that were available during my care of the patient were reviewed by me and considered in my medical decision making (see chart for details).   Patient is well-appearing, normotensive, afebrile, not tachycardic, not tachypneic, oxygenating well on room air.   1. Mild intermittent asthma with acute exacerbation Suspect secondary to untreated allergies Continue Flonase, start  cetirizine  or loratadine/fexofenadine DuoNeb and Depo-Medrol  40 mg IM were given in urgent care today with improvement in air movement, SpO2 increased, patient reported subjective improvement Start oral prednisone  tomorrow Recommended follow-up with primary care provider if symptoms recur after stopping prednisone  or did not fully improve; strict ER precautions if symptoms worsen despite treatment  The patient was given the opportunity to ask questions.  All questions answered to their satisfaction.  The patient is in agreement to this plan.   Final Clinical Impressions(s) / UC Diagnoses   Final diagnoses:  Mild intermittent asthma with acute exacerbation     Discharge Instructions      Chest xray is negative for pneumonia.    We gave you a breathing treatment and injection of steroid medication to help with your breathing.  Continue the Flonase and also recommend starting an antihistamine daily like Zyrtec  or Claritin.  Start taking the oral prednisone  tomorrow.    If symptoms do not significantly improve with treatment or if they recur after you stop the prednisone , follow up with PCP to discuss further.  If symptoms worsen despite treatment, seek care emergently.   ED Prescriptions     Medication Sig Dispense Auth. Provider   predniSONE  (DELTASONE ) 20 MG tablet Take 2 tablets (40 mg total) by mouth daily with breakfast for 5 days. 10 tablet Wilhemena Harbour, NP      PDMP not reviewed this encounter.   Wilhemena Harbour, NP 03/15/24 1754

## 2024-03-17 ENCOUNTER — Telehealth: Payer: Self-pay

## 2024-03-17 ENCOUNTER — Other Ambulatory Visit (HOSPITAL_COMMUNITY): Payer: Self-pay

## 2024-03-17 ENCOUNTER — Ambulatory Visit: Admitting: Student in an Organized Health Care Education/Training Program

## 2024-03-17 VITALS — BP 124/72 | HR 78 | Wt 148.0 lb

## 2024-03-17 DIAGNOSIS — J4520 Mild intermittent asthma, uncomplicated: Secondary | ICD-10-CM

## 2024-03-17 DIAGNOSIS — G43E09 Chronic migraine with aura, not intractable, without status migrainosus: Secondary | ICD-10-CM | POA: Diagnosis not present

## 2024-03-17 MED ORDER — BUDESONIDE-FORMOTEROL FUMARATE 80-4.5 MCG/ACT IN AERO: 2.0000 | INHALATION_SPRAY | Freq: Two times a day (BID) | RESPIRATORY_TRACT | 3 refills | Status: DC

## 2024-03-17 MED ORDER — NURTEC 75 MG PO TBDP: 75.0000 mg | ORAL_TABLET | ORAL | 2 refills | Status: DC

## 2024-03-17 NOTE — Assessment & Plan Note (Signed)
 Chronic.  Recent asthma exacerbation treated by urgent care.  Currently on prednisone .  She has a subjective sensation of shortness of breath.  Exam is reassuring and I will hear any wheezing.  Overall her asthma seems to be at least moderate persistent at this point.  We talked about changing inhaler therapy.  Will start Symbicort daily.  She can continue albuterol  as needed for reliever therapy, but if this is not working at next visit we can talk about the use of Symbicort for anti-inflammatory reliever therapy.

## 2024-03-17 NOTE — Telephone Encounter (Signed)
 Pharmacy Patient Advocate Encounter  Received notification from [facility] that Prior Authorization for Nurtec 75 has been APPROVED from 03/17/24 to 03/17/25. Ran test claim, Copay is $4.00. This test claim was processed through [facility]- copay amounts may vary at other pharmacies due to pharmacy/plan contracts, or as the patient moves through the different stages of their insurance plan.   PA #/Case ID/Reference #: [id number]

## 2024-03-17 NOTE — Telephone Encounter (Signed)
 Called patient to inform her of the approval. Patient stated she will go to [facility] to receive this medication but if it is too expensive she would like to have it sent to pharmacy below.  [facility] on [address]

## 2024-03-17 NOTE — Progress Notes (Signed)
   Established Patient Office Visit  Subjective   Patient ID: Pamela Scott, female    DOB: [birth year]  Age: 58 y.o. MRN: [id number]  HPI  58 year old person here for follow-up of migraines.  I saw her about a month ago, she was having uncontrolled migraines at the time with over 10 headache days per month.  We started Ajovy , she tolerated the medication well.  No side effects.  We reviewed her headache diary, she had about 7 disabling migraines over the last 30 days.  This is still interfering with her activities of daily living on a regular basis.  She also endorses a recent asthma exacerbation treated at urgent care about 2 days ago.  Has been feeling more short of breath and tight in her chest.  Says her asthma has not been this bad since the 1990s.  Using albuterol  inhaler.  She was given systemic steroids with prednisone  and is on day 3 of those right now.  Feeling somewhat better.  No recent illness, no fevers or chills.    Objective:     BP 124/72   Pulse 78   Wt 148 lb (67.1 kg)   SpO2 98%   BMI 23.79 kg/m    Physical Exam  Gen: Well-appearing Heart: Regular, no murmur Lungs: Unlabored, good air movement throughout, no wheezing inspiratory or expiratory, some mild crackles heard at bilateral bases Ext: warm, no edema    Assessment & Plan:   Problem List Items Addressed This Visit       High   Chronic migraine with aura - Primary (Chronic)   Chronic issue.  Started Ajovy  4 weeks ago.  We reviewed her headache diary today.  Over the last 4 weeks she has had 7 disabling headaches, this is slightly down from 10 the month prior.  Headache burden still seems higher than acceptable.  She has tried [employer] before and also did not have an adequate response.  In addition to the CGRP we are using topiramate 100 mg twice daily because of these difficult to treat headaches.  At this point I am going to discontinue Ajovy  and start Nurtec 75 mg every other day for migraine  prophy.  Follow-up with me in 2 months to review the headache diary again.      Relevant Medications   Rimegepant Sulfate (NURTEC) 75 MG TBDP   Mild intermittent asthma, uncomplicated (Chronic)   Chronic.  Recent asthma exacerbation treated by urgent care.  Currently on prednisone .  She has a subjective sensation of shortness of breath.  Exam is reassuring and I will hear any wheezing.  Overall her asthma seems to be at least moderate persistent at this point.  We talked about changing inhaler therapy.  Will start Symbicort daily.  She can continue albuterol  as needed for reliever therapy, but if this is not working at next visit we can talk about the use of Symbicort for anti-inflammatory reliever therapy.      Relevant Medications   budesonide-formoterol (SYMBICORT) 80-4.5 MCG/ACT inhaler    No follow-ups on file.    Ether Hercules, MD

## 2024-03-17 NOTE — Assessment & Plan Note (Signed)
 Chronic issue.  Started Ajovy  4 weeks ago.  We reviewed her headache diary today.  Over the last 4 weeks she has had 7 disabling headaches, this is slightly down from 10 the month prior.  Headache burden still seems higher than acceptable.  She has tried [employer] before and also did not have an adequate response.  In addition to the CGRP we are using topiramate 100 mg twice daily because of these difficult to treat headaches.  At this point I am going to discontinue Ajovy  and start Nurtec 75 mg every other day for migraine prophy.  Follow-up with me in 2 months to review the headache diary again.

## 2024-03-17 NOTE — Telephone Encounter (Signed)
 Pharmacy Patient Advocate Encounter   Received notification from Patient Pharmacy that prior authorization for Nurtec 75 is required/requested.   Insurance verification completed.   The patient is insured through [facility] .   Per test claim: PA required; PA submitted to above mentioned insurance via CoverMyMeds Key/confirmation #/EOC [id number] Status is pending

## 2024-03-18 ENCOUNTER — Telehealth: Payer: Self-pay

## 2024-03-18 ENCOUNTER — Other Ambulatory Visit (HOSPITAL_COMMUNITY): Payer: Self-pay

## 2024-03-18 DIAGNOSIS — J4520 Mild intermittent asthma, uncomplicated: Secondary | ICD-10-CM

## 2024-03-18 NOTE — Telephone Encounter (Addendum)
 Pharmacy Patient Advocate Encounter   Received notification from [employer] that prior authorization for Budesonide -Formoterol  Fumarate 80-4.5MCG/ACT aerosol is required/requested.   Insurance verification completed.   The patient is insured through [facility] .   Per test claim: Brand name Symbicort  80-4.5MCG INHALER is preferred by the insurance.  -Called pharmacy and informed them.

## 2024-03-21 MED ORDER — BUDESONIDE-FORMOTEROL FUMARATE 80-4.5 MCG/ACT IN AERO: 2.0000 | INHALATION_SPRAY | Freq: Two times a day (BID) | RESPIRATORY_TRACT | 3 refills | Status: AC

## 2024-03-21 NOTE — Telephone Encounter (Signed)
**Note De-identified  [place] Obfuscation** Please advise 

## 2024-03-21 NOTE — Addendum Note (Signed)
 Addended by: Kamari Buch K on: 03/21/2024 03:01 PM   Modules accepted: Orders

## 2024-03-21 NOTE — Telephone Encounter (Signed)
 Sent brand medication with note to pharmacy to inform them.

## 2024-03-21 NOTE — Telephone Encounter (Signed)
 Ok to switch to brand name inhaler as this is preferred.  Will likely need to note on the prescription- Insurance prefers brand name medication- to avoid any confusion

## 2024-03-23 ENCOUNTER — Other Ambulatory Visit (HOSPITAL_COMMUNITY): Payer: Self-pay

## 2024-03-29 ENCOUNTER — Ambulatory Visit: Admitting: Student in an Organized Health Care Education/Training Program

## 2024-04-04 ENCOUNTER — Ambulatory Visit: Admitting: Licensed Clinical Social Worker

## 2024-04-04 DIAGNOSIS — F331 Major depressive disorder, recurrent, moderate: Secondary | ICD-10-CM | POA: Diagnosis not present
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
## 2024-04-04 NOTE — Progress Notes (Deleted)
 [facility] Counselor/Therapist Progress Note  Patient ID: Pamela Scott, MRN: [id number]    Date: 04/04/24  Time Spent: ***  {LBBHAMPM:26719} - *** {LBBHAMPM:26719} : *** Minutes  Treatment Type: Individual Therapy.  Reported Symptoms: ***  Mental Status Exam: Appearance:  {PSY:22683}     Behavior: {PSY:21022743}  Motor: {PSY:22302}  Speech/Language:  {PSY:22685}  Affect: {PSY:22687}  Mood: {PSY:31886}  Thought process: {PSY:31888}  Thought content:   {[phone]}  Sensory/Perceptual disturbances:   {PSY:614-275-2289}  Orientation: {PSY:30297}  Attention: {PSY:22877}  Concentration: {PSY:785-044-9343}  Memory: {PSY:251-085-2113}  Fund of knowledge:  {PSY:785-044-9343}  Insight:   {PSY:785-044-9343}  Judgment:  {PSY:785-044-9343}  Impulse Control: {PSY:785-044-9343}   Risk Assessment: Danger to Self:  {PSY:22692} Self-injurious Behavior: {PSY:22692} Danger to Others: {PSY:22692} Duty to Warn:{PSY:311194} Physical Aggression / Violence:{PSY:21197} Access to Firearms a concern: {EDB:78802} Gang Involvement:{PSY:21197}  Subjective:   Pamela Scott participated from {Patient Location:[address]::home}, via {LBBHVIDEOORPHONE:26720}, and consented to treatment. Therapist participated from {LBBHPROVIDERLOCATION:26721}. We met online due to COVID pandemic.   ***   Interventions: {[phone]}  Diagnosis: No diagnosis found.   Plan: ***Patient is to use CBT, mindfulness and coping skills to help manage decrease symptoms associated with their diagnosis.   Long-term goal:   ***Reduce overall level, frequency, and intensity of the feelings of depression, anxiety and panic evidenced by       decreased irritability, negative self talk, and helpless feelings from 6 to 7 days/week to 0 to 1 days/week per client report for at least 3 consecutive months.  Short-term goal:  ***Verbally express understanding of the relationship between feelings of depression, anxiety  and their impact on thinking patterns and behaviors. Verbalize an understanding of the role that distorted thinking plays in creating fears, excessive worry, and ruminations.  Damien Junk MSW, LCSW/DATE

## 2024-04-05 DIAGNOSIS — M2550 Pain in unspecified joint: Secondary | ICD-10-CM | POA: Diagnosis not present

## 2024-04-05 DIAGNOSIS — Z79891 Long term (current) use of opiate analgesic: Secondary | ICD-10-CM | POA: Diagnosis not present

## 2024-04-05 DIAGNOSIS — G8929 Other chronic pain: Secondary | ICD-10-CM | POA: Diagnosis not present

## 2024-04-05 DIAGNOSIS — M545 Low back pain, unspecified: Secondary | ICD-10-CM | POA: Diagnosis not present

## 2024-04-05 DIAGNOSIS — M47816 Spondylosis without myelopathy or radiculopathy, lumbar region: Secondary | ICD-10-CM | POA: Diagnosis not present

## 2024-04-18 ENCOUNTER — Ambulatory Visit (INDEPENDENT_AMBULATORY_CARE_PROVIDER_SITE_OTHER): Admitting: Licensed Clinical Social Worker

## 2024-04-18 DIAGNOSIS — F331 Major depressive disorder, recurrent, moderate: Secondary | ICD-10-CM

## 2024-04-18 NOTE — Progress Notes (Signed)
 [facility] Counselor/Therapist Progress Note  Patient ID: Pamela Scott, MRN: [id number]    Date: 04/18/24  Time Spent: 00159  pm - 0252 am : 53 Minutes  Treatment Type: Individual Therapy.  Reported Symptoms: Patient reports that she doesn't have a life. Feels like she is home all the time. Her niece is 69 and handicapped. This requires her to be at home all the time. Patient has a 42/59 year old autistic son she adopted at 10 months old. Patient reports that being a caregiver is at times overwhelming.   Mental Status Exam: Appearance:  Casual     Behavior: Appropriate  Motor: Normal  Speech/Language:  Clear and Coherent  Affect: Appropriate  Mood: normal  Thought process: normal  Thought content:   WNL  Sensory/Perceptual disturbances:   WNL  Orientation: oriented to person, place, time/date, situation, day of week, month of year, and year  Attention: Good  Concentration: Good  Memory: WNL  Fund of knowledge:  Good  Insight:   Good  Judgment:  Good  Impulse Control: Good   Risk Assessment: Danger to Self:  No Self-injurious Behavior: No Danger to Others: No Duty to Warn:no Physical Aggression / Violence:No  Access to Firearms a concern: No  Gang Involvement:No   Subjective:   Randine Macario Croak participated from home, via video. Patient is aware of risk and limitations and consented to treatment. Therapist participated from office located at [facility]. We met online due to patient request.  Baelyn presented for her session in a positive mood. Ikran shared that she has been on vacation and it was nice to get away. She reports that she feels unfulfilled in her marriage and has been for many years. She reports that her husband is not very loving and they have not been sexually intimate in over 7 years. She reports that they had a misunderstanding and refused to talk about it. She states that once they spoke about it, her husband reported that  physically he was no longer able to perform due to his health. Marishka reports that she will remain in her marriage and has no plans of leaving the marriage. She reports taking her vows seriously and there has been no infidelity which would be the only acceptable reason for divorce in her eyes. Aeriel states she desires to learn to be happy on her own and to enjoy things in life even if her husband doesn't want to partake.   Clinician actively listened and provided positive support via verbal interaction with patient. Clinician processed with patient the importance of self care and getting enough sleep and exercise. Clinician encouraged patient to check into resources for respite care for her niece and her autistic son so that she may have a day or two each week to relax and get out of the house. Clinician provided patient with information related to her son's medicaid resources that could provide some assitance.  Jadore was fully engaged in discussion and is polite and cooperative. Vada is motivated for change and open to ideas and suggestions to improve her overall quality of life. Geralynn will utilize coping skills to cope with her depressive symptoms. Malaney will continue to engage in bi weekly therapy sessions. Treatment plan to be reviewed by 59/30/2026.     Interventions: Cognitive Behavioral Therapy, Dialectical Behavioral Therapy, Assertiveness/Communication, Motivational Interviewing, Solution-Oriented/Positive Psychology, and Insight-Oriented  Diagnosis: Major depressive disorder, recurrent moderate   Damien Junk MSW, LCSW/DATE 04/18/2024

## 2024-04-22 ENCOUNTER — Telehealth: Payer: Self-pay

## 2024-04-22 NOTE — Telephone Encounter (Signed)
 Pharmacy Patient Advocate Encounter   Received notification from CoverMyMeds that prior authorization for AJOVY  is due for renewal.   Insurance verification completed.   The patient is insured through [facility].  Action: Medication has been discontinued. Archived Key: [id number]

## 2024-05-03 ENCOUNTER — Telehealth: Payer: Self-pay

## 2024-05-03 ENCOUNTER — Ambulatory Visit: Admitting: Student in an Organized Health Care Education/Training Program

## 2024-05-03 ENCOUNTER — Telehealth (INDEPENDENT_AMBULATORY_CARE_PROVIDER_SITE_OTHER): Admitting: Student in an Organized Health Care Education/Training Program

## 2024-05-03 ENCOUNTER — Ambulatory Visit: Payer: Self-pay

## 2024-05-03 ENCOUNTER — Ambulatory Visit: Admitting: Licensed Clinical Social Worker

## 2024-05-03 DIAGNOSIS — H8110 Benign paroxysmal vertigo, unspecified ear: Secondary | ICD-10-CM | POA: Diagnosis not present

## 2024-05-03 MED ORDER — MECLIZINE HCL 25 MG PO TABS: 25.0000 mg | ORAL_TABLET | Freq: Two times a day (BID) | ORAL | 0 refills | Status: AC | PRN

## 2024-05-03 NOTE — Telephone Encounter (Signed)
 Pt states that she already spoke to the PCP. No questions at this time, encouraged to call back should she have any questions.

## 2024-05-03 NOTE — Telephone Encounter (Signed)
 Noted that patient needs appt cxl today. I have notified front desk

## 2024-05-03 NOTE — Telephone Encounter (Signed)
 Call dropped upon transfer. This RN made first attempt to triage patient. No answer, LVM. Routing for additional attempts.   Copied from CRM [id number]. Topic: Clinical - Red Word Triage >> May 03, 2024  4:26 PM Lavanda D wrote: Red Word that prompted transfer to Nurse Triage: Patient missed visit for today + Still experiencing symptoms. Appt notes state the following: I have had vertigo since last Thurs. I have had no accident or injuries. Had this once before. It is very bad. I turn my head & through whole room spins.SABRA

## 2024-05-03 NOTE — Telephone Encounter (Signed)
 Copied from CRM [id number]. Topic: Appointments - Appointment Cancel/Reschedule >> May 03, 2024  8:51 AM Cleave MATSU wrote: Patient/patient representative is calling to cancel or reschedule an appointment. Refer to attachments for appointment information.  Pt need video appt for today canceled. It wont allow me to cancel it because status says arrived.

## 2024-05-03 NOTE — Assessment & Plan Note (Signed)
 Intermittent vertigo has persisted for five days, is positional, worsens with movement, and improves when still. There are no new neurological symptoms or signs of high-risk conditions. The Epley maneuver was ineffective.  She has had this issue come up a few times over the last 10 years.  Usually is self-limited.  No recent illness.  No migraine symptoms right now.  We talked about the risks of meclizine  including sedation and increased risk of fall, decided to prescribe meclizine  for symptomatic relief, cautioned about sedative effects and fall risk. Continue the Epley maneuver and suggested online resources for proper technique.  I recommended physical therapy, patient declines for now, wants to try the Epley maneuver more at home and give it more time with meclizine .  I advised contacting if symptoms persist by Monday and we can refer to physical therapy then.

## 2024-05-03 NOTE — Patient Instructions (Signed)
  VISIT SUMMARY: Today, you were seen for vertigo that has been troubling you for the past five days. You described the sensation as feeling like you are on a fast merry-go-round, which worsens with movement and improves when you are still. You also mentioned experiencing headaches and a migraine today, which you believe is due to stress. We discussed your symptoms and created a plan to help manage them.  YOUR PLAN: -BENIGN PAROXYSMAL POSITIONAL VERTIGO: This type of vertigo is caused by changes in head position and is not due to a serious condition. We will prescribe meclizine  to help with the symptoms, but be aware that it can make you drowsy and increase your risk of falling. Continue trying the Epley maneuver, and you can find online resources to ensure you are doing it correctly. If your symptoms do not improve by Monday, please contact us  for a possible referral to physical therapy.  -CHRONIC MIGRAINE WITH AURA: This type of migraine includes visual disturbances and can be triggered by stress. Your current headache is likely due to stress. There were no new neurological symptoms or recent cluster migraines noted.  INSTRUCTIONS: Please follow the prescribed treatment for vertigo and continue with the Epley maneuver. If your vertigo symptoms persist by Monday, contact us  for a potential referral to physical therapy. Be cautious with meclizine  as it can cause drowsiness and increase your risk of falling.

## 2024-05-03 NOTE — Progress Notes (Unsigned)
  [facility] Counselor/Therapist Progress Note  Patient ID: Pamela Scott, MRN: [id number]    Date: 05/03/24  Time Spent: ***  {LBBHAMPM:26719} - *** {LBBHAMPM:26719} : *** Minutes  Treatment Type: Individual Therapy.  Reported Symptoms: ***  Mental Status Exam: Appearance:  {PSY:22683}     Behavior: {PSY:21022743}  Motor: {PSY:22302}  Speech/Language:  {PSY:22685}  Affect: {PSY:22687}  Mood: {PSY:31886}  Thought process: {PSY:31888}  Thought content:   {PSY:973-677-3885}  Sensory/Perceptual disturbances:   {[phone]}  Orientation: {PSY:30297}  Attention: {PSY:22877}  Concentration: {PSY:931-257-2252}  Memory: {PSY:878-430-3207}  Fund of knowledge:  {PSY:931-257-2252}  Insight:   {PSY:931-257-2252}  Judgment:  {PSY:931-257-2252}  Impulse Control: {PSY:931-257-2252}   Risk Assessment: Danger to Self:  {PSY:22692} Self-injurious Behavior: {PSY:22692} Danger to Others: {PSY:22692} Duty to Warn:{PSY:311194} Physical Aggression / Violence:{PSY:21197} Access to Firearms a concern: {EDB:78802} Gang Involvement:{PSY:21197}  Subjective:   Pamela Scott participated from {Patient Location:[address]::home}, via {LBBHVIDEOORPHONE:26720}, and consented to treatment. Therapist participated from {LBBHPROVIDERLOCATION:26721}. We met online due to COVID pandemic.   ***   Interventions: {[phone]}  Diagnosis: No diagnosis found.   Plan: ***Patient is to use CBT, mindfulness and coping skills to help manage decrease symptoms associated with their diagnosis.   Long-term goal:   ***Reduce overall level, frequency, and intensity of the feelings of depression, anxiety and panic evidenced by       decreased irritability, negative self talk, and helpless feelings from 6 to 7 days/week to 0 to 1 days/week per client report for at least 3 consecutive months.  Short-term goal:  ***Verbally express understanding of the relationship between feelings of depression, anxiety  and their impact on thinking patterns and behaviors. Verbalize an understanding of the role that distorted thinking plays in creating fears, excessive worry, and ruminations.  Damien Junk MSW, LCSW/DATE

## 2024-05-03 NOTE — Progress Notes (Signed)
 Acute Video Visit  I connected with  Pamela Scott on 05/03/24 by a video enabled telemedicine application and verified that I am speaking with the correct person using two identifiers.   I discussed the limitations of evaluation and management by telemedicine. The patient expressed understanding and agreed to proceed.   Subjective:     Patient ID: Pamela Scott, female    DOB: 1966/09/22, 58 y.o.   MRN: [id number]  Chief Complaint  Patient presents with   Dizziness    HPI  Discussed the use of AI scribe software for clinical note transcription with the patient, who gave verbal consent to proceed.  History of Present Illness Pamela Scott is a 58 year old female who presents with vertigo. She is accompanied by her son.  She has been experiencing vertigo for the past five days, described as positional and occurring with head movements or standing up. The sensation is likened to 'being on a merry-go-round at sixty miles an hour.' The vertigo is less noticeable when sitting still but worsens with activity. It has been persistent without significant improvement.  She has a history of similar vertigo episodes approximately ten years ago and again about a year ago, which resolved spontaneously. She attempted to use an old prescription medication, methotrexate, but found it ineffective. She has also tried the Epley maneuver without relief.  In addition to vertigo, she experiences headaches, which she attributes to recent weather changes, and a migraine today, believed to be stress-related. No new neurological symptoms such as weakness or changes in her usual eye droopiness, which she describes as 'irritating as always.'  She is responsible for caring for her niece, which she manages despite her symptoms, although she finds it challenging to perform other household tasks due to the vertigo.      Objective:     Physical Exam  Gen: Well-appearing woman Neuro: Alert,  conversational, she has chronic left-sided facial asymmetry that is unchanged from prior visits, otherwise moving upper and lower extremities normally, speech is normal and fluent, no nystagmus visible on camera     Assessment & Plan:   Problem List Items Addressed This Visit       Unprioritized   BPPV (benign paroxysmal positional vertigo) - Primary   Intermittent vertigo has persisted for five days, is positional, worsens with movement, and improves when still. There are no new neurological symptoms or signs of high-risk conditions. The Epley maneuver was ineffective.  She has had this issue come up a few times over the last 10 years.  Usually is self-limited.  No recent illness.  No migraine symptoms right now.  We talked about the risks of meclizine  including sedation and increased risk of fall, decided to prescribe meclizine  for symptomatic relief, cautioned about sedative effects and fall risk. Continue the Epley maneuver and suggested online resources for proper technique.  I recommended physical therapy, patient declines for now, wants to try the Epley maneuver more at home and give it more time with meclizine .  I advised contacting if symptoms persist by Monday and we can refer to physical therapy then.      Relevant Medications   meclizine  (ANTIVERT ) 25 MG tablet    Meds ordered this encounter  Medications   meclizine  (ANTIVERT ) 25 MG tablet    Sig: Take 1 tablet (25 mg total) by mouth 2 (two) times daily as needed for dizziness.    Dispense:  20 tablet    Refill:  0  Return if symptoms worsen or fail to improve.  Pamela Debby Specking, MD

## 2024-05-10 DIAGNOSIS — M47816 Spondylosis without myelopathy or radiculopathy, lumbar region: Secondary | ICD-10-CM | POA: Diagnosis not present

## 2024-05-10 DIAGNOSIS — M2550 Pain in unspecified joint: Secondary | ICD-10-CM | POA: Diagnosis not present

## 2024-05-10 DIAGNOSIS — M545 Low back pain, unspecified: Secondary | ICD-10-CM | POA: Diagnosis not present

## 2024-05-10 DIAGNOSIS — Z79891 Long term (current) use of opiate analgesic: Secondary | ICD-10-CM | POA: Diagnosis not present

## 2024-05-10 DIAGNOSIS — G8929 Other chronic pain: Secondary | ICD-10-CM | POA: Diagnosis not present

## 2024-05-31 ENCOUNTER — Other Ambulatory Visit: Payer: Self-pay | Admitting: Student in an Organized Health Care Education/Training Program

## 2024-06-09 DIAGNOSIS — M545 Low back pain, unspecified: Secondary | ICD-10-CM | POA: Diagnosis not present

## 2024-06-09 DIAGNOSIS — G8929 Other chronic pain: Secondary | ICD-10-CM | POA: Diagnosis not present

## 2024-06-09 DIAGNOSIS — M2550 Pain in unspecified joint: Secondary | ICD-10-CM | POA: Diagnosis not present

## 2024-06-09 DIAGNOSIS — M47816 Spondylosis without myelopathy or radiculopathy, lumbar region: Secondary | ICD-10-CM | POA: Diagnosis not present

## 2024-06-09 DIAGNOSIS — Z79891 Long term (current) use of opiate analgesic: Secondary | ICD-10-CM | POA: Diagnosis not present

## 2024-07-11 ENCOUNTER — Other Ambulatory Visit: Payer: Self-pay | Admitting: Student in an Organized Health Care Education/Training Program

## 2024-07-14 ENCOUNTER — Other Ambulatory Visit: Payer: Self-pay | Admitting: Student in an Organized Health Care Education/Training Program

## 2024-08-02 ENCOUNTER — Telehealth: Admitting: Student in an Organized Health Care Education/Training Program

## 2024-08-02 ENCOUNTER — Other Ambulatory Visit: Payer: Self-pay | Admitting: Student in an Organized Health Care Education/Training Program

## 2024-08-02 DIAGNOSIS — G43E09 Chronic migraine with aura, not intractable, without status migrainosus: Secondary | ICD-10-CM

## 2024-08-02 DIAGNOSIS — G43E11 Chronic migraine with aura, intractable, with status migrainosus: Secondary | ICD-10-CM

## 2024-08-02 MED ORDER — PREDNISONE 20 MG PO TABS: 40.0000 mg | ORAL_TABLET | Freq: Every day | ORAL | 0 refills | Status: AC

## 2024-08-02 MED ORDER — NURTEC 75 MG PO TBDP: 75.0000 mg | ORAL_TABLET | ORAL | 2 refills | Status: DC

## 2024-08-02 MED ORDER — SUMATRIPTAN SUCCINATE 50 MG PO TABS: ORAL_TABLET | ORAL | 0 refills | Status: DC

## 2024-08-02 NOTE — Assessment & Plan Note (Addendum)
 Persistent, migraine is resistant to usual treatments, going on 7 days now consistent with intractable status migrainosus. There is a potential medication overuse headache due to frequent use of Goody Powders and Excedrin. Sumatriptan and prednisone  are considered due to severity. Further use of Goody Powders and Excedrin is advised against. Resuming Belbuca is recommended to avoid withdrawal. Prescribed Imitrex (sumatriptan) for acute migraine relief and prednisone  for migrainosus.  Discontinue Goody Powders and Excedrin. Resume Belbuca. Continue using Nurtec once daily if headaches persist. Refill Nurtec prescription and check with pharmacy for approval.  We also talked about her sleep, stress, hydration, and nutrition.

## 2024-08-02 NOTE — Progress Notes (Signed)
 MyChart Video Visit    Virtual Visit via Video Note   This format is felt to be most appropriate for this patient at this time. Physical exam was limited by quality of the video and audio technology used for the visit.    Patient location: home Provider location: [facility] Persons involved in the visit: patient, provider  I discussed the limitations of evaluation and management by telemedicine and the availability of in person appointments. The patient expressed understanding and agreed to proceed.  Patient: Pamela Scott   DOB: 1966/05/03   58 y.o. Female  MRN: [id number] Visit Date: 08/02/2024  Today's healthcare provider: Cleatus Debby Specking, MD   Chief Complaint  Patient presents with   Migraine    Migraine since Tuesday  Friday finally some relief but not going away  Migraines starting at back of head and moves forward  Goody powder,Excedrin,ibuprofen, 1/4 Muscle relaxer      Subjective:    HPI  Discussed the use of AI scribe software for clinical note transcription with the patient, who gave verbal consent to proceed.  History of Present Illness Pamela Scott is a 58 year old female who presents with persistent migraines since last Tuesday night.  She has been experiencing a migraine that began last Tuesday night, which differs from her usual migraines. The pain started at the occipital region and moved anteriorly, rather than being unilateral. She has a history of migraine clusters but notes that this episode has been more persistent and difficult to break.  Initially, she took Nurtec, which provided some relief but did not completely alleviate the headache. She also used Goody Powders and Excedrin, along with a quarter piece of her muscle relaxer, which she has used in the past with pain medication. Despite these efforts, she did not experience significant relief until Friday, but the  headache returned Friday night and persisted through Saturday. She describes the headache as 'hovering, waiting to come back' and notes that she had no relief on Tuesday, Wednesday, and Thursday, resorting to using ice packs on her head.  She last took Nurtec yesterday, which provided some relief but did not eliminate the headache. She has been trying to limit her use of Goody Powders and Excedrin to every four hours at most, and has been using ice packs to help her sleep after taking her son to school.  She has not taken her pain medication or gabapentin for about two and a half days to see if they were contributing to the headache, but this did not make a difference. She stopped taking Belbuca last Friday and went without it for a day, then resumed on Saturday, and again went without it on Sunday and Monday. She does not believe she is experiencing withdrawal symptoms, as the headache began before she stopped Belbuca.  No weakness, falls, fevers, chills, or neck pain. She reports no significant change in stress levels, although she acknowledges having a lot of stress due to caring for her autistic son and having her niece living with her. She is able to sleep a couple of hours after taking her son to school.    Objective:    Physical Exam   Gen: Uncomfortable appearing woman, in a dark room. Neuro: Alert, conversational, not sedated appearing, chronic left-sided ptosis, ambulating with good balance.  Full strength upper lower extremities, Psych:  Mildly depressed appearing, normal speech, organized thoughts    Assessment & Plan:    Problem List Items Addressed This Visit       High   Chronic migraine with aura - Primary (Chronic)   Persistent, migraine is resistant to usual treatments, going on 7 days now consistent with intractable status migrainosus. There is a potential medication overuse headache due to frequent use of Goody Powders and Excedrin. Sumatriptan and prednisone  are  considered due to severity. Further use of Goody Powders and Excedrin is advised against. Resuming Belbuca is recommended to avoid withdrawal. Prescribed Imitrex (sumatriptan) for acute migraine relief and prednisone  for migrainosus.  Discontinue Goody Powders and Excedrin. Resume Belbuca. Continue using Nurtec once daily if headaches persist. Refill Nurtec prescription and check with pharmacy for approval.  We also talked about her sleep, stress, hydration, and nutrition.      Relevant Medications   Buprenorphine HCl (BELBUCA) 75 MCG FILM   predniSONE  (DELTASONE ) 20 MG tablet   SUMAtriptan (IMITREX) 50 MG tablet   Rimegepant Sulfate (NURTEC) 75 MG TBDP     Meds ordered this encounter  Medications   predniSONE  (DELTASONE ) 20 MG tablet    Sig: Take 2 tablets (40 mg total) by mouth daily with breakfast for 3 days.    Dispense:  6 tablet    Refill:  0   SUMAtriptan (IMITREX) 50 MG tablet    Sig: Take 1 tablet by mouth as needed for migraine; may repeat after 2 hours if needed; MAX 200 mg/24 hours    Dispense:  10 tablet    Refill:  0   Rimegepant Sulfate (NURTEC) 75 MG TBDP    Sig: Take 1 tablet (75 mg total) by mouth every other day.    Dispense:  15 tablet    Refill:  2     Return if symptoms worsen or fail to improve.     I discussed the assessment and treatment plan with the patient. The patient was provided an opportunity to ask questions and all were answered. The patient agreed with the plan and demonstrated an understanding of the instructions.   The patient was advised to call back or seek an in-person evaluation if the symptoms worsen or if the condition fails to improve as anticipated.  I provided 10 minutes of non-face-to-face time during this encounter.  Cleatus Debby Specking, MD [facility]

## 2024-08-02 NOTE — Patient Instructions (Signed)
  VISIT SUMMARY: Today, we discussed your persistent migraines that have been troubling you since last Tuesday night. You described this migraine as different from your usual ones, and it has been resistant to your usual treatments. We reviewed your current medications and their usage, and we discussed a new treatment plan to help manage your symptoms.  YOUR PLAN: -MIGRAINE: A migraine is a type of headache characterized by intense, throbbing pain, often accompanied by nausea, vomiting, and sensitivity to light and sound. Your current migraine has been persistent and resistant to your usual treatments. We suspect that frequent use of Goody Powders and Excedrin may be contributing to your headache. We recommend stopping these medications. Instead, we are prescribing Imitrex (sumatriptan) for acute migraine relief and prednisone  for severe migraine episodes. You should resume taking Belbuca to avoid withdrawal symptoms. Continue using Nurtec once daily if your headaches persist. We will also refill your Nurtec prescription and check with the pharmacy for approval.  INSTRUCTIONS: Please follow the new medication plan as discussed. Stop using Goody Powders and Excedrin. Take Imitrex (sumatriptan) as prescribed for acute migraine relief and prednisone  for severe episodes. Resume taking Belbuca to avoid withdrawal symptoms. Continue using Nurtec once daily if needed. We will refill your Nurtec prescription and check with the pharmacy for approval. If your symptoms do not improve or if you experience any new symptoms, please contact our office.

## 2024-08-04 ENCOUNTER — Other Ambulatory Visit: Payer: Self-pay | Admitting: Student in an Organized Health Care Education/Training Program

## 2024-08-04 ENCOUNTER — Encounter: Payer: Self-pay | Admitting: Student in an Organized Health Care Education/Training Program

## 2024-08-04 DIAGNOSIS — G43E11 Chronic migraine with aura, intractable, with status migrainosus: Secondary | ICD-10-CM

## 2024-08-04 MED ORDER — SUMATRIPTAN SUCCINATE 100 MG PO TABS: ORAL_TABLET | ORAL | 1 refills | Status: DC

## 2024-08-04 NOTE — Telephone Encounter (Signed)
 Patient has many concerns with migraines still, Please see message below

## 2024-08-05 ENCOUNTER — Other Ambulatory Visit (HOSPITAL_COMMUNITY): Payer: Self-pay

## 2024-08-05 NOTE — Telephone Encounter (Unsigned)
 Copied from CRM #[id number]. Topic: Clinical - Prescription Issue >> Aug 05, 2024  3:12 PM Paige D wrote: Reason for CRM: pt calling stating insurance wont cover her medication  Imitrex 50 mg  Told pt I see a script said with a higher dosage of Imitrex 100 mg. Pt then said they wont fill that one because she filled the 50 mg not long ago. Pt is calling the pharmacy back to verify they cannot fill the 100 mg being its a new dosage. Pt will call back if any issues >> Aug 05, 2024  3:26 PM Thersia C wrote: SUMAtriptan (IMITREX) 100 MG tablet will not fill the 100mg  with out authorization

## 2024-08-05 NOTE — Telephone Encounter (Signed)
 Called [facility] in  to confirm that patient is taking 100mg  of sumatriptan.   Patient is needing a PA for sumatriptan. Sent to PA team  Patient is aware

## 2024-08-08 ENCOUNTER — Other Ambulatory Visit (HOSPITAL_COMMUNITY): Payer: Self-pay

## 2024-08-08 ENCOUNTER — Telehealth: Payer: Self-pay

## 2024-08-08 NOTE — Telephone Encounter (Signed)
 Pharmacy Patient Advocate Encounter   Received notification from Physician's Office that prior authorization for Summatriptan 100MG  tabs is required/requested.   Insurance verification completed.   The patient is insured through [employer].   Per test claim: PA required; PA submitted to above mentioned insurance via Latent Key/confirmation #/[id number] [facility] Status is pending

## 2024-08-09 NOTE — Telephone Encounter (Signed)
 Pharmacy Patient Advocate Encounter  Received notification from [employer] that Prior Authorization for Sumatriptan 100MG  Tabs has been APPROVED from 08/09/24 to 08/09/25   PA #/Case ID/Reference #: [id number]

## 2024-08-09 NOTE — Telephone Encounter (Signed)
 Called patient and she is aware of approval.

## 2024-08-15 ENCOUNTER — Telehealth: Payer: Self-pay

## 2024-08-15 NOTE — Telephone Encounter (Signed)
Sent to PCP for FYI

## 2024-08-15 NOTE — Telephone Encounter (Signed)
 Thank you :)

## 2024-08-16 DIAGNOSIS — M2550 Pain in unspecified joint: Secondary | ICD-10-CM | POA: Diagnosis not present

## 2024-08-16 DIAGNOSIS — M47816 Spondylosis without myelopathy or radiculopathy, lumbar region: Secondary | ICD-10-CM | POA: Diagnosis not present

## 2024-08-16 DIAGNOSIS — G8929 Other chronic pain: Secondary | ICD-10-CM | POA: Diagnosis not present

## 2024-08-16 DIAGNOSIS — Z79891 Long term (current) use of opiate analgesic: Secondary | ICD-10-CM | POA: Diagnosis not present

## 2024-08-16 DIAGNOSIS — M545 Low back pain, unspecified: Secondary | ICD-10-CM | POA: Diagnosis not present

## 2024-08-17 ENCOUNTER — Other Ambulatory Visit: Payer: Self-pay | Admitting: Student in an Organized Health Care Education/Training Program

## 2024-08-17 NOTE — Telephone Encounter (Signed)
 Requested Prescriptions   Pending Prescriptions Disp Refills   tiZANidine  (ZANAFLEX ) 4 MG tablet [Pharmacy Med Name: tiZANidine  HCl 4 MG Oral Tablet] 60 tablet 0    Sig: TAKE 1 TABLET BY MOUTH TWICE DAILY AS NEEDED FOR MUSCLE SPASM     Date of patient request: 08/17/2024 Last office visit: 03/17/2024 Upcoming visit: Visit date not found Date of last refill: 07/11/2024 Last refill amount: 60

## 2024-08-29 ENCOUNTER — Other Ambulatory Visit: Payer: Self-pay | Admitting: Student in an Organized Health Care Education/Training Program

## 2024-08-29 DIAGNOSIS — G43E09 Chronic migraine with aura, not intractable, without status migrainosus: Secondary | ICD-10-CM

## 2024-08-29 NOTE — Telephone Encounter (Signed)
 Requested Prescriptions   Pending Prescriptions Disp Refills   topiramate  (TOPAMAX ) 100 MG tablet [Pharmacy Med Name: Topiramate  100 MG Oral Tablet] 60 tablet 0    Sig: Take 1 tablet by mouth twice daily     Date of patient request: 08/29/2024 Last office visit: 03/17/2024 Upcoming visit: Visit date not found Date of last refill: 08/03/2024 Last refill amount: 60

## 2024-09-13 DIAGNOSIS — M545 Low back pain, unspecified: Secondary | ICD-10-CM | POA: Diagnosis not present

## 2024-09-13 DIAGNOSIS — Z79891 Long term (current) use of opiate analgesic: Secondary | ICD-10-CM | POA: Diagnosis not present

## 2024-09-13 DIAGNOSIS — M2550 Pain in unspecified joint: Secondary | ICD-10-CM | POA: Diagnosis not present

## 2024-09-13 DIAGNOSIS — G8929 Other chronic pain: Secondary | ICD-10-CM | POA: Diagnosis not present

## 2024-09-13 DIAGNOSIS — M47816 Spondylosis without myelopathy or radiculopathy, lumbar region: Secondary | ICD-10-CM | POA: Diagnosis not present

## 2024-09-26 ENCOUNTER — Other Ambulatory Visit: Payer: Self-pay | Admitting: Student in an Organized Health Care Education/Training Program

## 2024-09-26 ENCOUNTER — Encounter: Payer: Self-pay | Admitting: Student in an Organized Health Care Education/Training Program

## 2024-09-26 NOTE — Telephone Encounter (Signed)
 Tried to call back to make a follow up appt. Patient was supposed to follow up in August.

## 2024-09-27 ENCOUNTER — Other Ambulatory Visit: Payer: Self-pay

## 2024-09-27 MED ORDER — TIZANIDINE HCL 4 MG PO TABS: ORAL_TABLET | ORAL | 0 refills | Status: DC

## 2024-10-13 ENCOUNTER — Encounter: Payer: Self-pay | Admitting: Student in an Organized Health Care Education/Training Program

## 2024-10-13 ENCOUNTER — Ambulatory Visit (INDEPENDENT_AMBULATORY_CARE_PROVIDER_SITE_OTHER): Admitting: Student in an Organized Health Care Education/Training Program

## 2024-10-13 VITALS — BP 121/73 | HR 71 | Wt 150.0 lb

## 2024-10-13 DIAGNOSIS — E7800 Pure hypercholesterolemia, unspecified: Secondary | ICD-10-CM | POA: Diagnosis not present

## 2024-10-13 DIAGNOSIS — F325 Major depressive disorder, single episode, in full remission: Secondary | ICD-10-CM

## 2024-10-13 DIAGNOSIS — F419 Anxiety disorder, unspecified: Secondary | ICD-10-CM | POA: Diagnosis not present

## 2024-10-13 DIAGNOSIS — G43E11 Chronic migraine with aura, intractable, with status migrainosus: Secondary | ICD-10-CM

## 2024-10-13 DIAGNOSIS — J4540 Moderate persistent asthma, uncomplicated: Secondary | ICD-10-CM

## 2024-10-13 DIAGNOSIS — Z1231 Encounter for screening mammogram for malignant neoplasm of breast: Secondary | ICD-10-CM | POA: Diagnosis not present

## 2024-10-13 DIAGNOSIS — R5383 Other fatigue: Secondary | ICD-10-CM | POA: Diagnosis not present

## 2024-10-13 DIAGNOSIS — G43E09 Chronic migraine with aura, not intractable, without status migrainosus: Secondary | ICD-10-CM

## 2024-10-13 LAB — VITAMIN B12: Vitamin B-12: 758 pg/mL (ref 211–911)

## 2024-10-13 LAB — TSH: TSH: 0.81 u[IU]/mL (ref 0.35–5.50)

## 2024-10-13 LAB — IBC + FERRITIN
Ferritin: 6.7 ng/mL — ABNORMAL LOW (ref 10.0–291.0)
Iron: 64 ug/dL (ref 42–145)
Saturation Ratios: 12.7 % — ABNORMAL LOW (ref 20.0–50.0)
TIBC: 504 ug/dL — ABNORMAL HIGH (ref 250.0–450.0)
Transferrin: 360 mg/dL (ref 212.0–360.0)

## 2024-10-13 LAB — BASIC METABOLIC PANEL WITH GFR
BUN: 22 mg/dL (ref 6–23)
CO2: 25 meq/L (ref 19–32)
Calcium: 9 mg/dL (ref 8.4–10.5)
Chloride: 109 meq/L (ref 96–112)
Creatinine, Ser: 0.87 mg/dL (ref 0.40–1.20)
GFR: 73.13 mL/min
Glucose, Bld: 92 mg/dL (ref 70–99)
Potassium: 4.4 meq/L (ref 3.5–5.1)
Sodium: 141 meq/L (ref 135–145)

## 2024-10-13 LAB — LIPID PANEL
Cholesterol: 185 mg/dL (ref 28–200)
HDL: 65.9 mg/dL
LDL Cholesterol: 105 mg/dL — ABNORMAL HIGH (ref 10–99)
NonHDL: 118.98
Total CHOL/HDL Ratio: 3
Triglycerides: 68 mg/dL (ref 10.0–149.0)
VLDL: 13.6 mg/dL (ref 0.0–40.0)

## 2024-10-13 MED ORDER — BUSPIRONE HCL 15 MG PO TABS: 15.0000 mg | ORAL_TABLET | Freq: Two times a day (BID) | ORAL | Status: AC | PRN

## 2024-10-13 MED ORDER — NURTEC 75 MG PO TBDP: 75.0000 mg | ORAL_TABLET | Freq: Two times a day (BID) | ORAL | Status: AC | PRN

## 2024-10-13 MED ORDER — TOPIRAMATE 100 MG PO TABS: 100.0000 mg | ORAL_TABLET | Freq: Two times a day (BID) | ORAL | 2 refills | Status: AC

## 2024-10-13 NOTE — Assessment & Plan Note (Signed)
 Symptoms improve with inhaler use, with no wheezing or rattling noted. Continue Symbicort  every twelve hours and use albuterol  as needed for acute symptoms.

## 2024-10-13 NOTE — Assessment & Plan Note (Addendum)
 Mood has improved with Lexapro . Continue Lexapro  for mood stabilization.  She also uses as needed buspirone  for anxiety type symptoms which works pretty well for her.  Recently she is having increased sensation of fatigue which I think is most likely related to mood and exhaustion.  Will check labs today to look for vitamin deficiencies or anemia as the cause.  Talked about lifestyle modifications, she is stretched pretty thin taking care of her family.

## 2024-10-13 NOTE — Progress Notes (Signed)
 "  Established Patient Office Visit  Patient ID: Pamela Scott, female    DOB: May 24, 1966  Age: 59 y.o. MRN: [id number] PCP: Jerrell Cleatus Ned, MD  Chief Complaint  Patient presents with   Medical Management of Chronic Issues    Subjective:     HPI  Discussed the use of AI scribe software for clinical note transcription with the patient, who gave verbal consent to proceed.  History of Present Illness Pamela Scott is a 59 year old female with asthma and migraines who presents for follow-up on her breathing difficulties and migraine management.  She experiences ongoing shortness of breath, particularly during activities such as caring for her niece or chasing her six-year-old son. She uses a long-acting inhaler every twelve hours, which she finds effective, and occasionally uses a short-acting inhaler as needed. She sometimes coughs up mucus, which she attributes to sinus issues.  Her migraines have improved since resuming Topamax  at 100 mg daily, with three migraine days in the past month, which she considers manageable. She uses Nurtec as needed for infrequent migraines. A previous period of severe migraines lasting ten days was attributed to temporarily discontinuing Topamax .  Her mood is dependent on sleep quality, which is currently poor due to caregiving responsibilities. She uses Lexapro  for mood stabilization and notes significant mood changes if she misses a dose. Buspirone  is used for anxiety as needed.  She experiences fatigue and low energy levels, attributed to fragmented sleep. Over-the-counter supplements containing B12 and caffeine are used to help with energy, but she is cautious about their caffeine content.  Chronic pain is managed with a medicated patch, which she finds inconvenient due to its oral administration. She wants to switch to an external patch for ease of use.     Objective:     BP 121/73   Pulse 71   Wt 150 lb (68 kg)   BMI 24.11  kg/m   Physical Exam  Gen: Well-appearing woman, chronic ptosis of the left eyelid Neck: Normal thyroid , no nodules or adenopathy Heart: Regular, no murmur Lungs: Unlabored, clear throughout Ext: Warm, no edema    Assessment & Plan:   Problem List Items Addressed This Visit       High   Chronic migraine with aura - Primary (Chronic)   Migraines have reduced to three days per month with Topamax , and Nurtec is effective for infrequent episodes. Continue Topamax  daily and use Nurtec as needed for migraines.  Few months ago we did have to use prednisone  for a short period because of an intractable migraine, that was pretty effective for her.      Relevant Medications   BELBUCA 300 MCG FILM   gabapentin (NEURONTIN) 400 MG capsule   topiramate  (TOPAMAX ) 100 MG tablet   Rimegepant Sulfate (NURTEC) 75 MG TBDP   Other Relevant Orders   Basic metabolic panel with GFR   Moderate persistent asthma (Chronic)   Symptoms improve with inhaler use, with no wheezing or rattling noted. Continue Symbicort  every twelve hours and use albuterol  as needed for acute symptoms.        Medium    Depression, major, in remission (Chronic)   Mood has improved with Lexapro . Continue Lexapro  for mood stabilization.  She also uses as needed buspirone  for anxiety type symptoms which works pretty well for her.  Recently she is having increased sensation of fatigue which I think is most likely related to mood and exhaustion.  Will check labs today to look for vitamin  deficiencies or anemia as the cause.  Talked about lifestyle modifications, she is stretched pretty thin taking care of her family.      Relevant Medications   busPIRone  (BUSPAR ) 15 MG tablet   Anxiety disorder (Chronic)   Relevant Medications   busPIRone  (BUSPAR ) 15 MG tablet   Hypercholesterolemia (Chronic)   Relevant Orders   Lipid panel   Other Visit Diagnoses       Fatigue, unspecified type       Relevant Orders   Basic metabolic  panel with GFR   TSH   Vitamin B12   IBC + Ferritin     Screening mammogram for breast cancer       Relevant Orders   MM 3D SCREENING MAMMOGRAM BILATERAL BREAST       Return in about 6 months (around 04/12/2025).    Cleatus Debby Specking, MD [facility]   "

## 2024-10-13 NOTE — Assessment & Plan Note (Signed)
 Migraines have reduced to three days per month with Topamax , and Nurtec is effective for infrequent episodes. Continue Topamax  daily and use Nurtec as needed for migraines.  Few months ago we did have to use prednisone  for a short period because of an intractable migraine, that was pretty effective for her.

## 2024-10-13 NOTE — Patient Instructions (Signed)
" °  VISIT SUMMARY: Today, we discussed your ongoing breathing difficulties and migraine management. You reported that your asthma symptoms are manageable with your current inhalers, and your migraines have improved with Topamax . We also reviewed your mood and energy levels, which are influenced by your sleep quality and caregiving responsibilities. Additionally, we talked about your chronic pain management and general health maintenance.  YOUR PLAN: -CHRONIC MIGRAINE WITH AURA: Chronic migraine with aura is a type of headache that includes sensory disturbances. Your migraines have reduced to three days per month with Topamax , and Nurtec is effective for infrequent episodes. Continue taking Topamax  daily and use Nurtec as needed for migraines.  -MILD INTERMITTENT ASTHMA: Mild intermittent asthma is a condition where the airways narrow and swell, causing breathing difficulties. Your symptoms improve with inhaler use, with no wheezing or rattling noted. Continue using Symbicort  every twelve hours and use albuterol  as needed for acute symptoms.  -MAJOR DEPRESSIVE DISORDER, IN REMISSION: Major depressive disorder is a mood disorder causing persistent feelings of sadness and loss of interest. Your mood has improved with Lexapro . Continue taking Lexapro  for mood stabilization.  -GENERAL HEALTH MAINTENANCE: We discussed the importance of a mammogram for breast cancer screening. No Pap smears are needed post-hysterectomy. A mammogram is ordered for March, along with blood work to check blood counts, thyroid , and iron  levels.  INSTRUCTIONS: Please schedule your mammogram for March and complete the blood work as ordered. Continue with your current medications and follow up with any new or worsening symptoms.  "

## 2024-10-14 ENCOUNTER — Ambulatory Visit: Payer: Self-pay | Admitting: Student in an Organized Health Care Education/Training Program

## 2024-10-14 DIAGNOSIS — E611 Iron deficiency: Secondary | ICD-10-CM | POA: Insufficient documentation

## 2024-10-14 MED ORDER — POLYSACCHARIDE IRON COMPLEX 150 MG PO CAPS: 150.0000 mg | ORAL_CAPSULE | Freq: Every day | ORAL | 1 refills | Status: AC

## 2024-10-14 NOTE — Telephone Encounter (Signed)
 I did go ahead and schedule Lab only visit in 8 weeks, orders will need to be placed

## 2024-10-14 NOTE — Telephone Encounter (Signed)
 Patient is okay with repeating blood work but not sure about a colonoscopy as she did not like the prep part of the colonoscopy. Patient states she will think about the colonoscopy.

## 2024-10-23 ENCOUNTER — Other Ambulatory Visit: Payer: Self-pay | Admitting: Student in an Organized Health Care Education/Training Program

## 2024-10-23 DIAGNOSIS — G43E11 Chronic migraine with aura, intractable, with status migrainosus: Secondary | ICD-10-CM

## 2024-11-07 ENCOUNTER — Other Ambulatory Visit: Payer: Self-pay | Admitting: Student in an Organized Health Care Education/Training Program

## 2024-11-07 NOTE — Telephone Encounter (Signed)
 Requested Prescriptions   Pending Prescriptions Disp Refills   tiZANidine  (ZANAFLEX ) 4 MG tablet [Pharmacy Med Name: tiZANidine  HCl 4 MG Oral Tablet] 60 tablet 0    Sig: TAKE 1 TABLET BY MOUTH TWICE DAILY AS NEEDED FOR MUSCLE SPASM     Date of patient request: 11/07/2024 Last office visit: 10/13/2024 Upcoming visit: 04/14/2025 Date of last refill: 09/27/2024 Last refill amount: 60

## 2024-12-09 ENCOUNTER — Other Ambulatory Visit

## 2025-04-14 ENCOUNTER — Ambulatory Visit: Admitting: Student in an Organized Health Care Education/Training Program
